# Patient Record
Sex: Male | Born: 1965
Health system: Southern US, Community
[De-identification: ages and names within clinical notes are randomized; demographics above are authoritative.]

## PROBLEM LIST (undated history)

## (undated) DIAGNOSIS — B1920 Unspecified viral hepatitis C without hepatic coma: Secondary | ICD-10-CM

## (undated) DIAGNOSIS — I10 Essential (primary) hypertension: Secondary | ICD-10-CM

## (undated) DIAGNOSIS — S0990XA Unspecified injury of head, initial encounter: Secondary | ICD-10-CM

## (undated) DIAGNOSIS — M329 Systemic lupus erythematosus, unspecified: Secondary | ICD-10-CM

## (undated) DIAGNOSIS — F419 Anxiety disorder, unspecified: Secondary | ICD-10-CM

## (undated) HISTORY — PX: COLONOSCOPY: SHX174

## (undated) HISTORY — PX: FEMUR FRACTURE SURGERY: SHX633

---

## 1998-03-27 ENCOUNTER — Emergency Department (HOSPITAL_COMMUNITY): Admission: EM | Admit: 1998-03-27 | Discharge: 1998-03-27 | Payer: Self-pay | Admitting: Emergency Medicine

## 1999-09-20 ENCOUNTER — Encounter: Payer: Self-pay | Admitting: Emergency Medicine

## 1999-09-20 ENCOUNTER — Emergency Department (HOSPITAL_COMMUNITY): Admission: EM | Admit: 1999-09-20 | Discharge: 1999-09-20 | Payer: Self-pay | Admitting: Emergency Medicine

## 1999-09-22 ENCOUNTER — Emergency Department (HOSPITAL_COMMUNITY): Admission: EM | Admit: 1999-09-22 | Discharge: 1999-09-22 | Payer: Self-pay | Admitting: Emergency Medicine

## 1999-09-23 ENCOUNTER — Encounter: Payer: Self-pay | Admitting: Emergency Medicine

## 2004-10-09 ENCOUNTER — Emergency Department (HOSPITAL_COMMUNITY): Admission: EM | Admit: 2004-10-09 | Discharge: 2004-10-10 | Payer: Self-pay | Admitting: Family Medicine

## 2005-06-12 ENCOUNTER — Encounter: Admission: RE | Admit: 2005-06-12 | Discharge: 2005-06-12 | Payer: Self-pay | Admitting: Gastroenterology

## 2006-02-28 ENCOUNTER — Emergency Department (HOSPITAL_COMMUNITY): Admission: EM | Admit: 2006-02-28 | Discharge: 2006-02-28 | Payer: Self-pay | Admitting: Family Medicine

## 2006-05-17 ENCOUNTER — Encounter: Admission: RE | Admit: 2006-05-17 | Discharge: 2006-05-17 | Payer: Self-pay | Admitting: Gastroenterology

## 2009-03-14 ENCOUNTER — Emergency Department (HOSPITAL_COMMUNITY): Admission: EM | Admit: 2009-03-14 | Discharge: 2009-03-14 | Payer: Self-pay | Admitting: Family Medicine

## 2009-07-14 ENCOUNTER — Ambulatory Visit (HOSPITAL_COMMUNITY): Admission: RE | Admit: 2009-07-14 | Discharge: 2009-07-14 | Payer: Self-pay | Admitting: Rheumatology

## 2009-10-02 ENCOUNTER — Emergency Department (HOSPITAL_COMMUNITY): Admission: EM | Admit: 2009-10-02 | Discharge: 2009-10-03 | Payer: Self-pay | Admitting: *Deleted

## 2010-12-04 LAB — POCT I-STAT, CHEM 8
BUN: 15 mg/dL (ref 6–23)
Calcium, Ion: 1.09 mmol/L — ABNORMAL LOW (ref 1.12–1.32)
Chloride: 99 mEq/L (ref 96–112)
Creatinine, Ser: 1.2 mg/dL (ref 0.4–1.5)
Glucose, Bld: 170 mg/dL — ABNORMAL HIGH (ref 70–99)
HCT: 38 % — ABNORMAL LOW (ref 39.0–52.0)
Hemoglobin: 12.9 g/dL — ABNORMAL LOW (ref 13.0–17.0)
Potassium: 3.6 mEq/L (ref 3.5–5.1)
Sodium: 132 mEq/L — ABNORMAL LOW (ref 135–145)
TCO2: 28 mmol/L (ref 0–100)

## 2011-02-17 IMAGING — CR DG HAND COMPLETE 3+V*L*
3 series · 3 of 3 positions shown · non-contrast
Comparison: None

CLINICAL DATA: Laceration

LEFT HAND - COMPLETE 3+ VIEW

[x hand pa left]
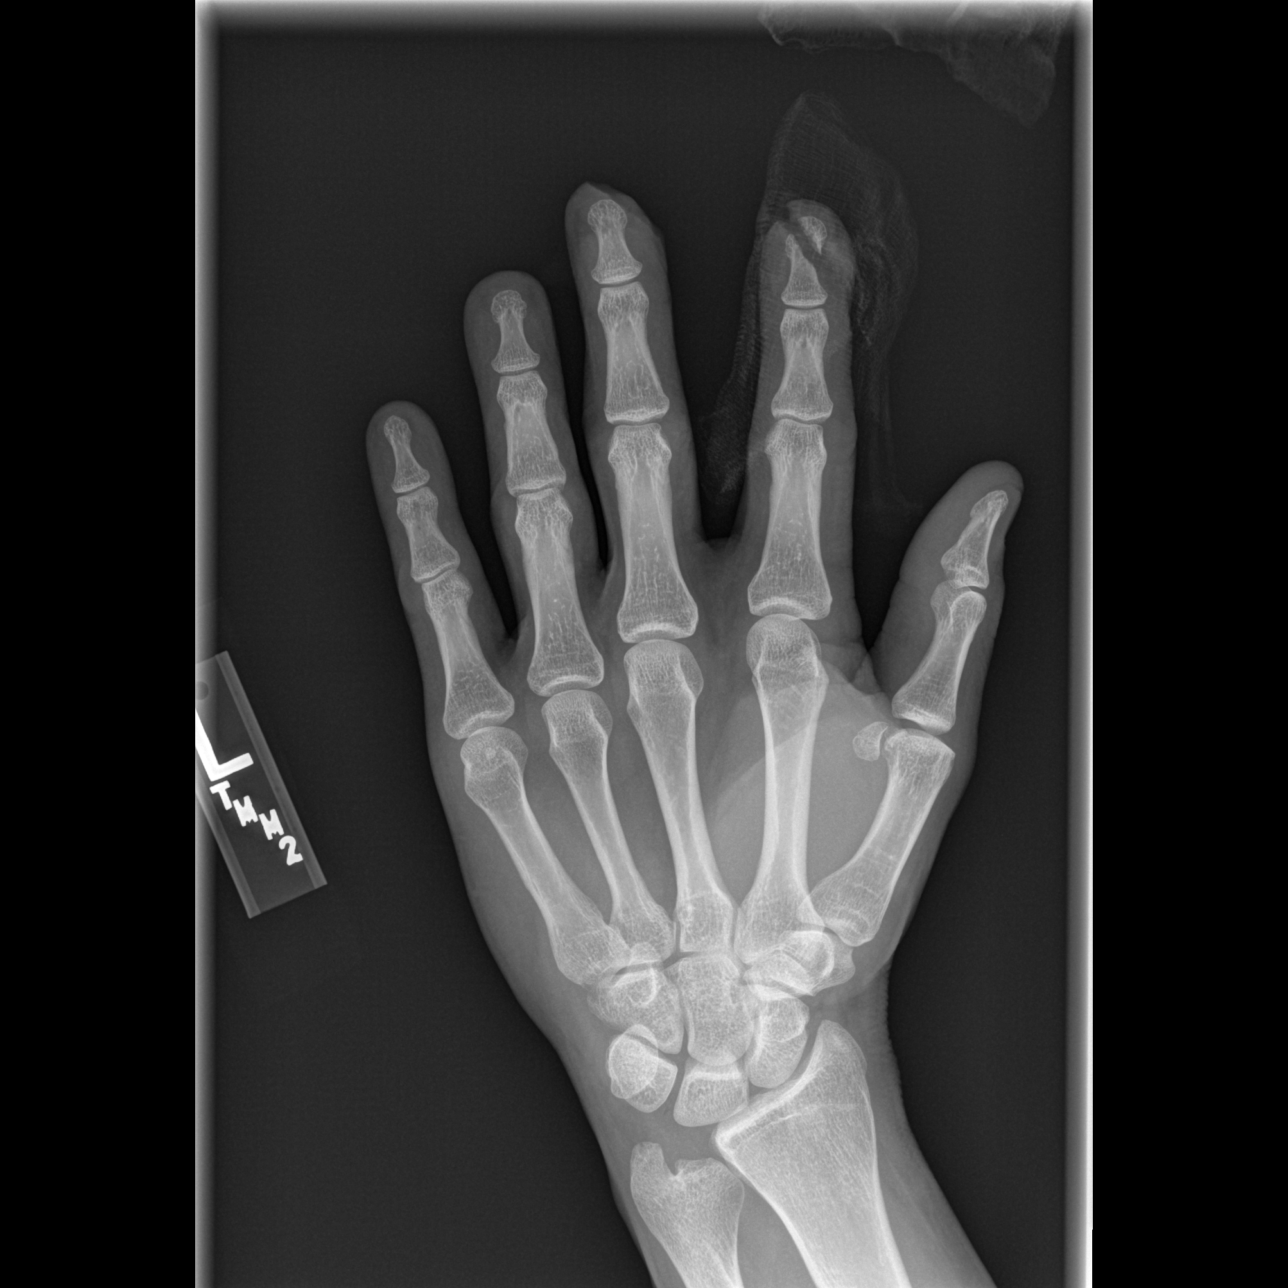

[x hand oblique left]
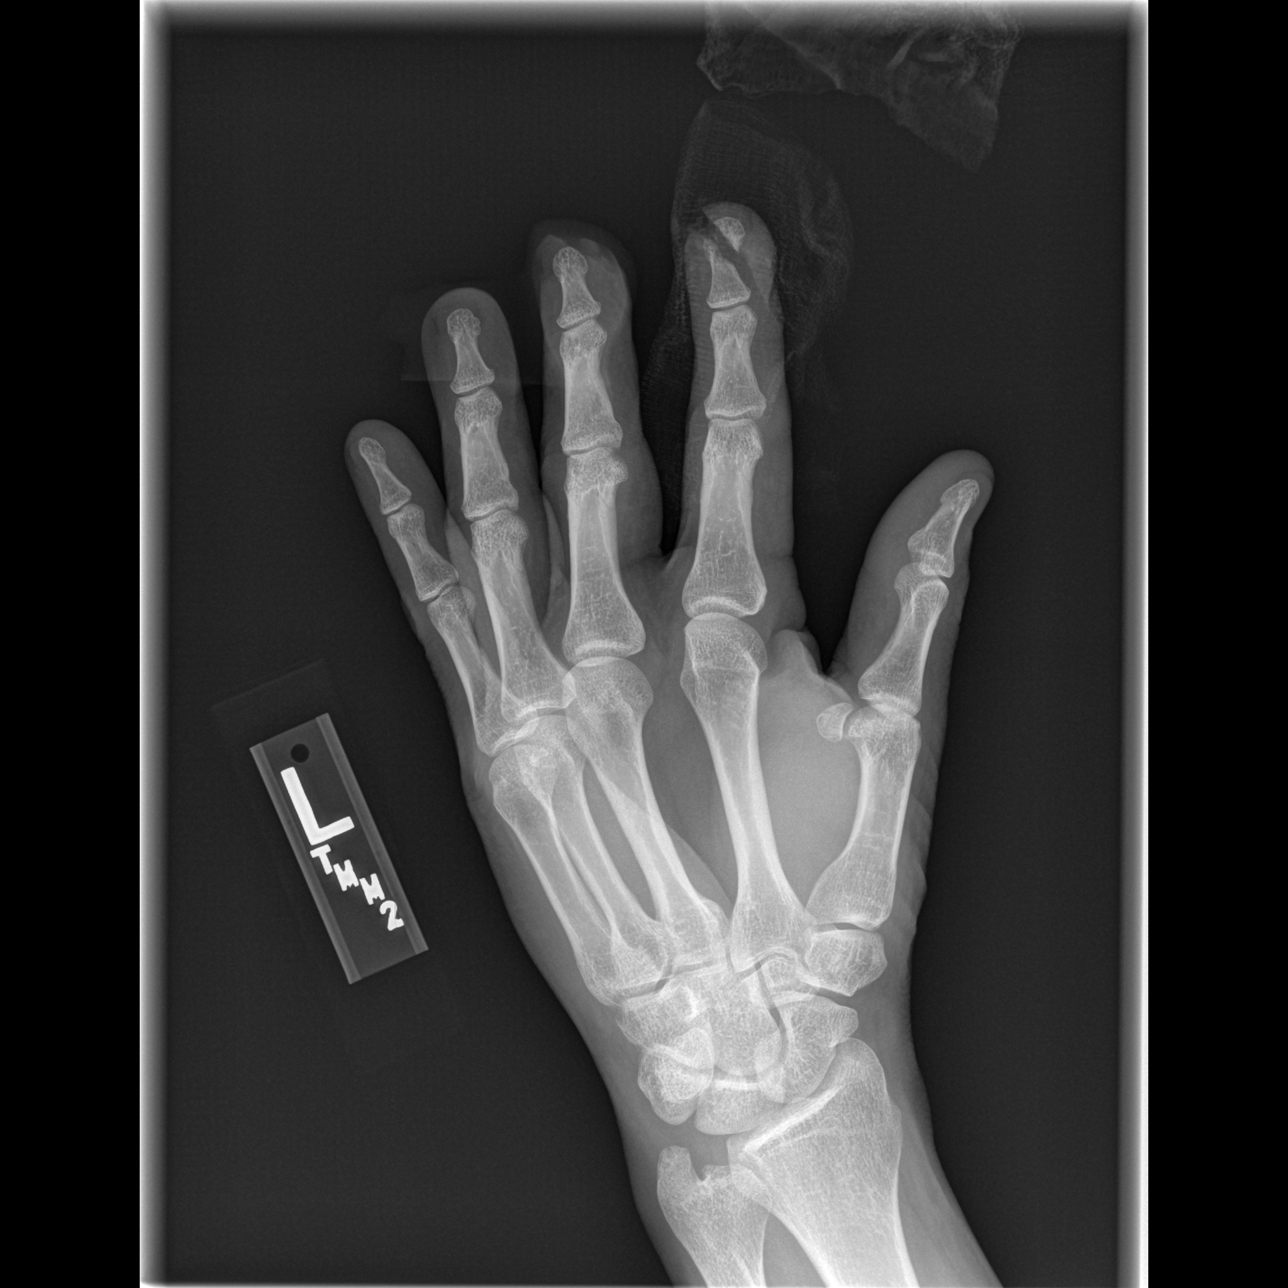

[x hand lat left]
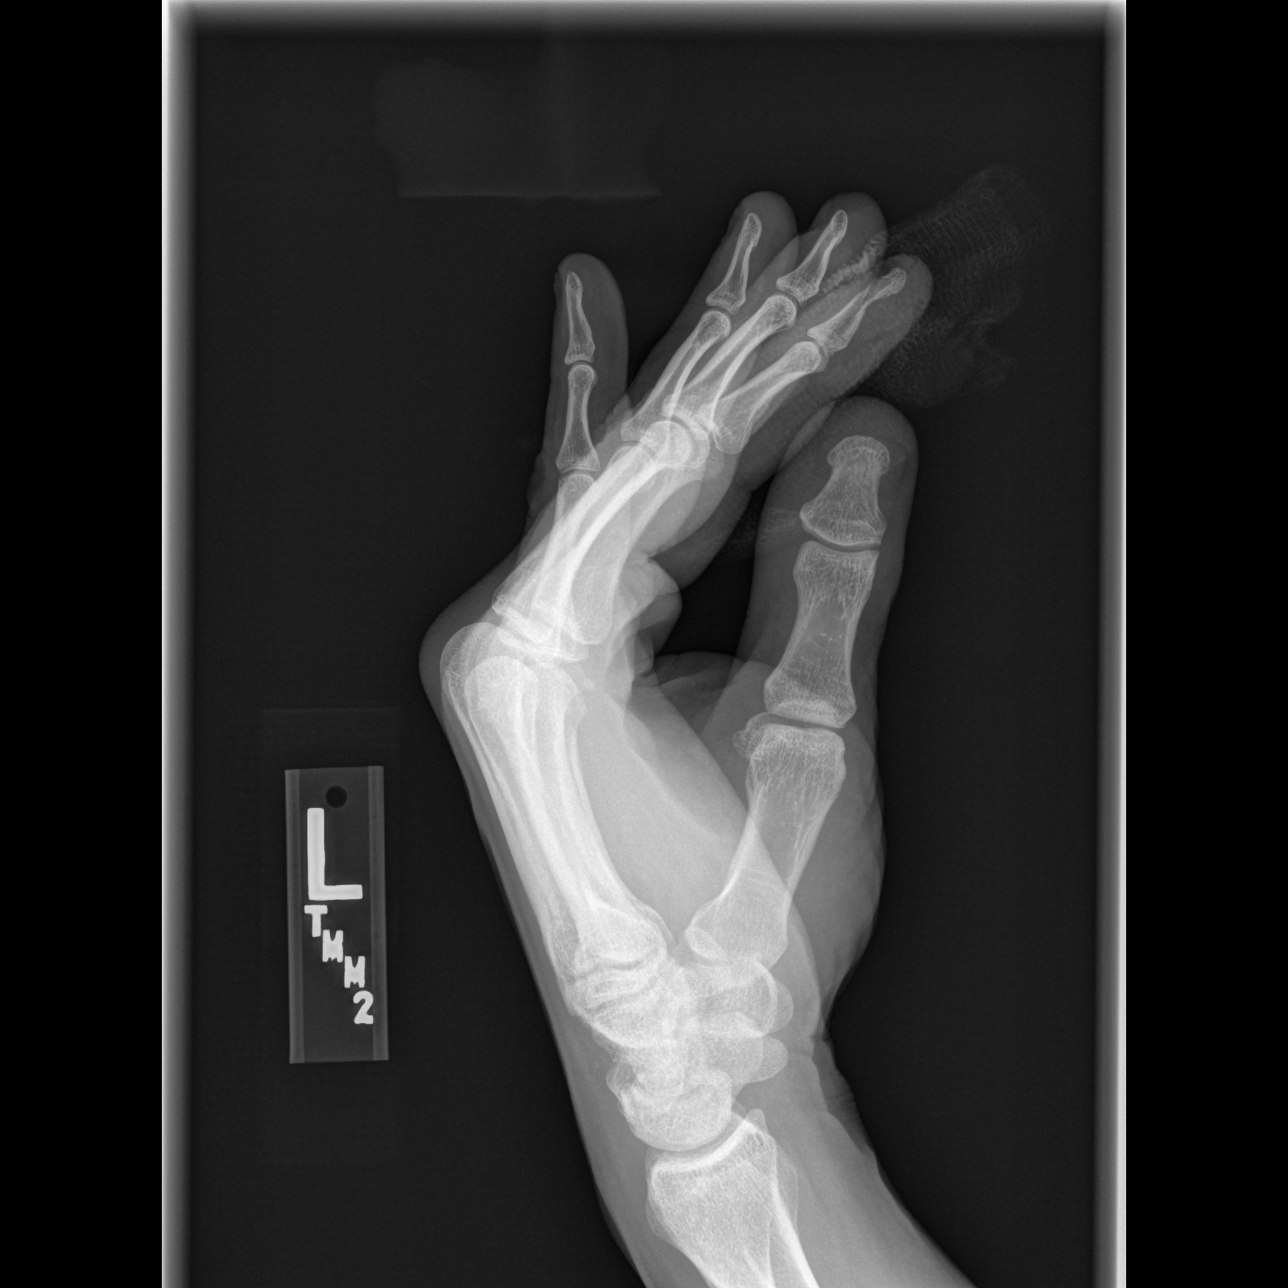

[3 of 3 positions shown; findings below may reference images not displayed]

FINDINGS: There has been soft tissue and bone amputation of the
tuft of the second distal phalanx.

Soft tissue amputation of the overlying the tuft of the third
distal phalanx is also noted.

The remaining osseous structures are intact.
IMPRESSION: 1.  There has been amputation of the tuft of the second distal
phalanx and soft tissues overlying the third distal phalanx.

## 2012-09-29 ENCOUNTER — Encounter (HOSPITAL_BASED_OUTPATIENT_CLINIC_OR_DEPARTMENT_OTHER): Payer: Self-pay | Admitting: *Deleted

## 2012-09-29 ENCOUNTER — Emergency Department (HOSPITAL_BASED_OUTPATIENT_CLINIC_OR_DEPARTMENT_OTHER)
Admission: EM | Admit: 2012-09-29 | Discharge: 2012-09-29 | Disposition: A | Discharge status: Home / Self Care | Payer: BC Managed Care – PPO | Attending: Emergency Medicine | Admitting: Emergency Medicine

## 2012-09-29 DIAGNOSIS — F411 Generalized anxiety disorder: Secondary | ICD-10-CM | POA: Insufficient documentation

## 2012-09-29 DIAGNOSIS — Z79899 Other long term (current) drug therapy: Secondary | ICD-10-CM | POA: Insufficient documentation

## 2012-09-29 DIAGNOSIS — Z8619 Personal history of other infectious and parasitic diseases: Secondary | ICD-10-CM | POA: Insufficient documentation

## 2012-09-29 DIAGNOSIS — Y9389 Activity, other specified: Secondary | ICD-10-CM | POA: Insufficient documentation

## 2012-09-29 DIAGNOSIS — F17200 Nicotine dependence, unspecified, uncomplicated: Secondary | ICD-10-CM | POA: Insufficient documentation

## 2012-09-29 DIAGNOSIS — S335XXA Sprain of ligaments of lumbar spine, initial encounter: Secondary | ICD-10-CM | POA: Insufficient documentation

## 2012-09-29 DIAGNOSIS — Z8739 Personal history of other diseases of the musculoskeletal system and connective tissue: Secondary | ICD-10-CM | POA: Insufficient documentation

## 2012-09-29 DIAGNOSIS — S39012A Strain of muscle, fascia and tendon of lower back, initial encounter: Secondary | ICD-10-CM

## 2012-09-29 DIAGNOSIS — Y929 Unspecified place or not applicable: Secondary | ICD-10-CM | POA: Insufficient documentation

## 2012-09-29 DIAGNOSIS — X500XXA Overexertion from strenuous movement or load, initial encounter: Secondary | ICD-10-CM | POA: Insufficient documentation

## 2012-09-29 DIAGNOSIS — I10 Essential (primary) hypertension: Secondary | ICD-10-CM | POA: Insufficient documentation

## 2012-09-29 HISTORY — DX: Unspecified viral hepatitis C without hepatic coma: B19.20

## 2012-09-29 HISTORY — DX: Essential (primary) hypertension: I10

## 2012-09-29 HISTORY — DX: Anxiety disorder, unspecified: F41.9

## 2012-09-29 HISTORY — DX: Systemic lupus erythematosus, unspecified: M32.9

## 2012-09-29 MED ORDER — CYCLOBENZAPRINE HCL 10 MG PO TABS: 10.0000 mg | ORAL_TABLET | Freq: Two times a day (BID) | ORAL | Status: DC | PRN

## 2012-09-29 MED ORDER — OXYCODONE-ACETAMINOPHEN 5-325 MG PO TABS: 2.0000 | ORAL_TABLET | ORAL | Status: DC | PRN

## 2012-09-29 MED ORDER — KETOROLAC TROMETHAMINE 60 MG/2ML IM SOLN
60.0000 mg | Freq: Once | INTRAMUSCULAR | Status: AC
  Administered 2012-09-29: 60 mg via INTRAMUSCULAR
  Filled 2012-09-29: qty 2

## 2012-09-29 MED ORDER — IBUPROFEN 800 MG PO TABS: 800.0000 mg | ORAL_TABLET | Freq: Three times a day (TID) | ORAL | Status: DC

## 2012-09-29 MED ORDER — HYDROMORPHONE HCL PF 2 MG/ML IJ SOLN
2.0000 mg | Freq: Once | INTRAMUSCULAR | Status: AC
  Administered 2012-09-29: 2 mg via INTRAMUSCULAR
  Filled 2012-09-29: qty 1

## 2012-09-29 NOTE — ED Notes (Signed)
Patient was trying to move a car and hurt his back.

## 2012-09-29 NOTE — ED Provider Notes (Signed)
Medical screening examination/treatment/procedure(s) were performed by non-physician practitioner and as supervising physician I was immediately available for consultation/collaboration.   Gavin Pound. Oletta Lamas, MD 09/29/12 2244

## 2012-09-29 NOTE — ED Provider Notes (Signed)
Medical screening examination/treatment/procedure(s) were performed by non-physician practitioner and as supervising physician I was immediately available for consultation/collaboration.   Gavin Pound. Oletta Lamas, MD 09/29/12 1610

## 2012-09-29 NOTE — ED Provider Notes (Addendum)
History     CSN: [id number]  Arrival date & time 09/29/12  1833   None     Chief Complaint  Patient presents with  . Back Pain    (Consider location/radiation/quality/duration/timing/severity/associated sxs/prior treatment) Patient is a 47 y.o. male presenting with back pain. The history is provided by the patient. No language interpreter was used.  Back Pain  This is a new problem. The current episode started 12 to 24 hours ago. The problem occurs constantly. The problem has been gradually worsening. Associated with: pushing a car. The pain is present in the lumbar spine. The quality of the pain is described as aching. The pain radiates to the left thigh. The pain is at a severity of 7/10. The pain is moderate. The pain is the same all the time. Stiffness is present all day.  Pt was trying to push a car and had sudden pain in his back.  Pt denies any problems with his back in the past.   Pt reports he has had high blood pressure  Past Medical History  Diagnosis Date  . Lupus   . Hypertension   . Hepatitis C   . Anxiety     History reviewed. No pertinent past surgical history.  No family history on file.  History  Substance Use Topics  . Smoking status: Current Every Day Smoker  . Smokeless tobacco: Not on file  . Alcohol Use: No      Review of Systems  Musculoskeletal: Positive for back pain.  All other systems reviewed and are negative.    Allergies  Review of patient's allergies indicates no known allergies.  Home Medications   Current Outpatient Rx  Name  Route  Sig  Dispense  Refill  . ALPRAZOLAM 0.25 MG PO TABS   Oral   Take 0.25 mg by mouth at bedtime as needed.         Marland Kitchen LISINOPRIL 10 MG PO TABS   Oral   Take 10 mg by mouth daily.         Marland Kitchen PANTOPRAZOLE SODIUM 20 MG PO TBEC   Oral   Take 20 mg by mouth daily.         . SERTRALINE HCL 100 MG PO TABS   Oral   Take 100 mg by mouth daily.         Marland Kitchen VARENICLINE TARTRATE 0.5 MG PO TABS   Oral   Take 0.5 mg by mouth 2 (two) times daily.         Marland Kitchen ZOLPIDEM TARTRATE 10 MG PO TABS   Oral   Take 10 mg by mouth at bedtime as needed.           BP 173/103  Pulse 72  Temp 98.1 F (36.7 C) (Oral)  Resp 20  SpO2 100%  Physical Exam  Nursing note and vitals reviewed. Constitutional: He appears well-developed and well-nourished.  Eyes: Pupils are equal, round, and reactive to light.  Neck: Normal range of motion.  Cardiovascular: Normal rate and normal heart sounds.   Pulmonary/Chest: Effort normal.  Abdominal: Soft.  Musculoskeletal:       Tender left lower lumbar area,  Decreased range of motion,  From lower extremities  Neurological: He is alert.  Skin: Skin is warm.    ED Course  Procedures (including critical care time)  Labs Reviewed - No data to display No results found.   1. Lumbar strain       MDM  Percocet and flexeril  Lonia Skinner [place], [place] 09/29/12 2242  Lonia Skinner [place], [place] 09/29/12 [phone]

## 2015-02-19 ENCOUNTER — Ambulatory Visit
Admission: RE | Admit: 2015-02-19 | Discharge: 2015-02-19 | Disposition: A | Discharge status: Home / Self Care | Payer: 59 | Source: Ambulatory Visit | Attending: Family Medicine | Admitting: Family Medicine

## 2015-02-19 ENCOUNTER — Other Ambulatory Visit: Payer: Self-pay | Admitting: Family Medicine

## 2015-02-19 DIAGNOSIS — J449 Chronic obstructive pulmonary disease, unspecified: Secondary | ICD-10-CM

## 2015-12-31 ENCOUNTER — Ambulatory Visit (HOSPITAL_COMMUNITY)
Admission: EM | Admit: 2015-12-31 | Discharge: 2015-12-31 | Disposition: A | Discharge status: Home / Self Care | Payer: 59 | Attending: Emergency Medicine | Admitting: Emergency Medicine

## 2015-12-31 ENCOUNTER — Ambulatory Visit (INDEPENDENT_AMBULATORY_CARE_PROVIDER_SITE_OTHER): Payer: 59

## 2015-12-31 ENCOUNTER — Encounter (HOSPITAL_COMMUNITY): Payer: Self-pay

## 2015-12-31 DIAGNOSIS — S99911A Unspecified injury of right ankle, initial encounter: Secondary | ICD-10-CM | POA: Diagnosis not present

## 2015-12-31 MED ORDER — HYDROCODONE-ACETAMINOPHEN 5-325 MG PO TABS: 1.0000 | ORAL_TABLET | Freq: Four times a day (QID) | ORAL | Status: DC | PRN

## 2015-12-31 NOTE — ED Notes (Signed)
Patient states he stepped through a ramp on a trailer and injured his right leg, injury took place last Sunday 12/19/2015 patient has taken ibuprofen and aleve for pain. No acute distress Wife at bedside

## 2015-12-31 NOTE — ED Provider Notes (Signed)
CSN: [id number]     Arrival date & time 12/31/15  1848 History   First MD Initiated Contact with Patient 12/31/15 1915     Chief Complaint  Patient presents with  . Leg Injury   (Consider location/radiation/quality/duration/timing/severity/associated sxs/prior Treatment) HPI He is a 50 year old man here for evaluation of right ankle injury. 5 days ago he was unloading a trailer when his right foot went through the trailer. He has had swelling, bruising, and pain since that time. He is able to walk on it, but with a limp and discomfort.  Past Medical History  Diagnosis Date  . Lupus (HCC)   . Hypertension   . Hepatitis C   . Anxiety    History reviewed. No pertinent past surgical history. No family history on file. Social History  Substance Use Topics  . Smoking status: Current Every Day Smoker  . Smokeless tobacco: None  . Alcohol Use: No    Review of Systems As in history of present illness Allergies  Review of patient's allergies indicates no known allergies.  Home Medications   Prior to Admission medications   Medication Sig Start Date End Date Taking? Authorizing Provider  ALPRAZolam (XANAX) 0.25 MG tablet Take 0.25 mg by mouth at bedtime as needed.   Yes Historical Provider, MD  cyclobenzaprine (FLEXERIL) 10 MG tablet Take 1 tablet (10 mg total) by mouth 2 (two) times daily as needed for muscle spasms. 09/29/12  Yes Lonia Skinner Sofia, PA-C  ibuprofen (ADVIL,MOTRIN) 800 MG tablet Take 1 tablet (800 mg total) by mouth 3 (three) times daily. 09/29/12  Yes Lonia Skinner Sofia, PA-C  lisinopril (PRINIVIL,ZESTRIL) 10 MG tablet Take 10 mg by mouth daily.   Yes Historical Provider, MD  pantoprazole (PROTONIX) 20 MG tablet Take 20 mg by mouth daily.   Yes Historical Provider, MD  sertraline (ZOLOFT) 100 MG tablet Take 100 mg by mouth daily.   Yes Historical Provider, MD  HYDROcodone-acetaminophen (NORCO) 5-325 MG tablet Take 1 tablet by mouth every 6 (six) hours as needed for moderate  pain. 12/31/15   Charm Rings, MD  oxyCODONE-acetaminophen (PERCOCET/ROXICET) 5-325 MG per tablet Take 2 tablets by mouth every 4 (four) hours as needed for pain. 09/29/12   Elson Areas, PA-C  varenicline (CHANTIX) 0.5 MG tablet Take 0.5 mg by mouth 2 (two) times daily.    Historical Provider, MD  zolpidem (AMBIEN) 10 MG tablet Take 10 mg by mouth at bedtime as needed.    Historical Provider, MD   Meds Ordered and Administered this Visit  Medications - No data to display  BP 157/86 mmHg  Pulse 84  Temp(Src) 98.6 F (37 C) (Oral)  SpO2 97% No data found.   Physical Exam  Constitutional: He is oriented to person, place, and time. He appears well-developed and well-nourished. No distress.  Cardiovascular: Normal rate.   Pulmonary/Chest: Effort normal.  Musculoskeletal:  Right ankle: He has diffuse swelling and bruising in the ankle and foot. He is tender over the medial malleolus primarily. He does have a nodule just superior to the medial malleolus most consistent with a hematoma. Palpable DP pulse.  Neurological: He is alert and oriented to person, place, and time.    ED Course  Procedures (including critical care time)  Labs Review Labs Reviewed - No data to display  Imaging Review Dg Ankle Complete Right  12/31/2015  CLINICAL DATA:  Right ankle pain, swelling and bruising after his foot got caught on a trailer 5 days ago. EXAM:  RIGHT ANKLE - COMPLETE 3+ VIEW COMPARISON:  None. FINDINGS: Diffuse soft tissue swelling. This is slightly more focal with a subcutaneous hematoma medial to the distal tibia. Distal tibia fixation hardware is noted. No acute fracture or dislocation seen. No ankle joint effusion. Minimal calcaneal spur formation. IMPRESSION: 1. No fracture. 2. Soft tissue swelling and soft tissue hematoma in the medial aspect of the distal leg. Electronically Signed   By: Beckie SaltsSteven  Reid M.D.   On: 12/31/2015 20:19     MDM   1. Right ankle injury, initial encounter     X-rays negative. Conservative management with rest, ice, elevation. Crutches for the next several days. Recommended avoiding aspirin and ibuprofen due to the bruising. Prescription for hydrocodone provided. Follow-up as needed.    Charm RingsErin J Nayleen Janosik, MD 12/31/15 2034

## 2015-12-31 NOTE — Discharge Instructions (Signed)
There are no broken bones in your ankle. You do have a lot of bruising and swelling. Keep your foot elevated and ice on the ankle as much as you can for the next 2 days. Use crutches for the next 2 days. Avoid aspirin and ibuprofen as these can make bruising worse. Use the hydrocodone every 4-6 hours as needed for pain. Follow-up as needed.

## 2016-07-06 IMAGING — CR DG CHEST 2V
2 series · 2 of 2 positions shown · non-contrast
Comparison: None.

CLINICAL DATA: COPD, cough, shortness of Breath

EXAM:
CHEST  2 VIEW

[w chest pa]
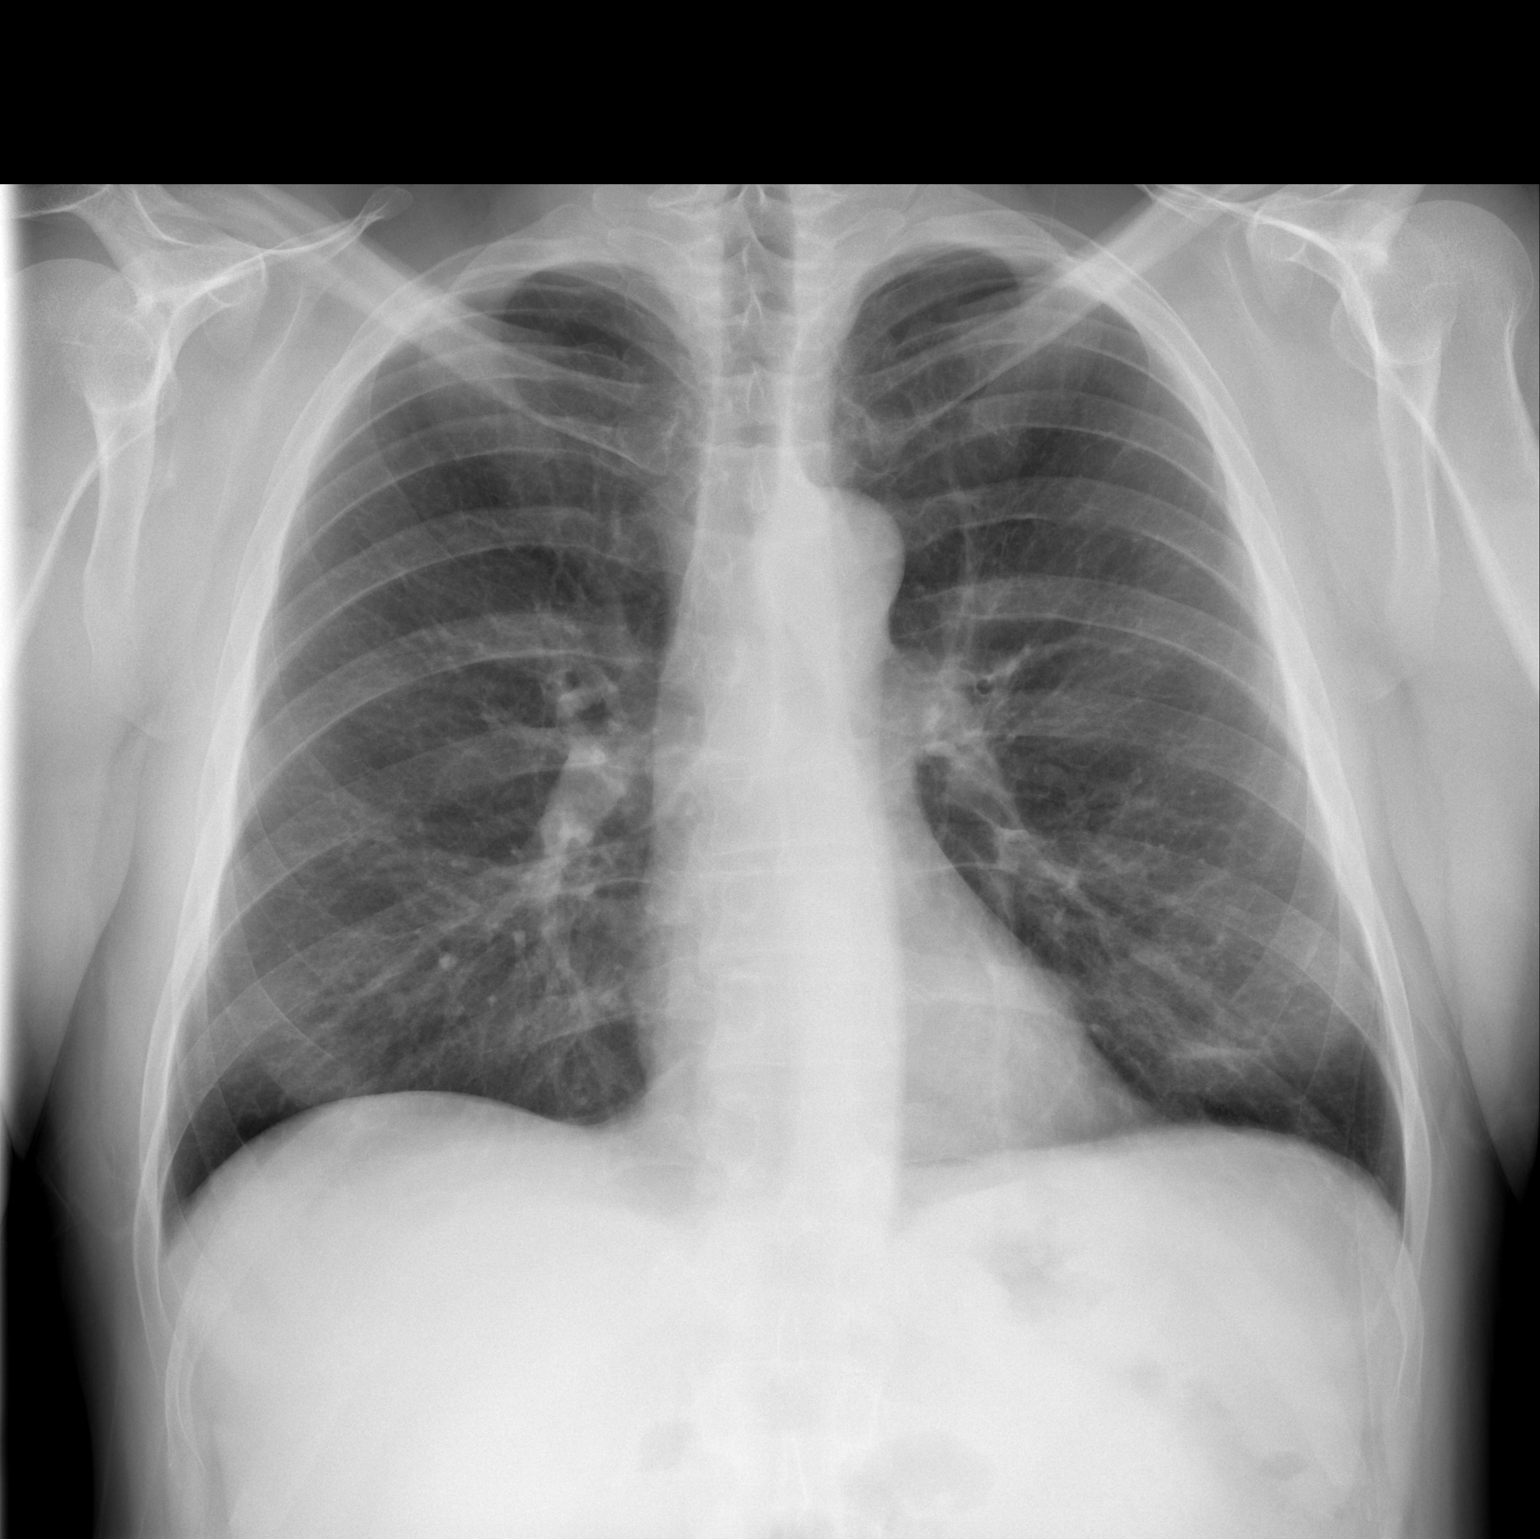

[w chest lat]
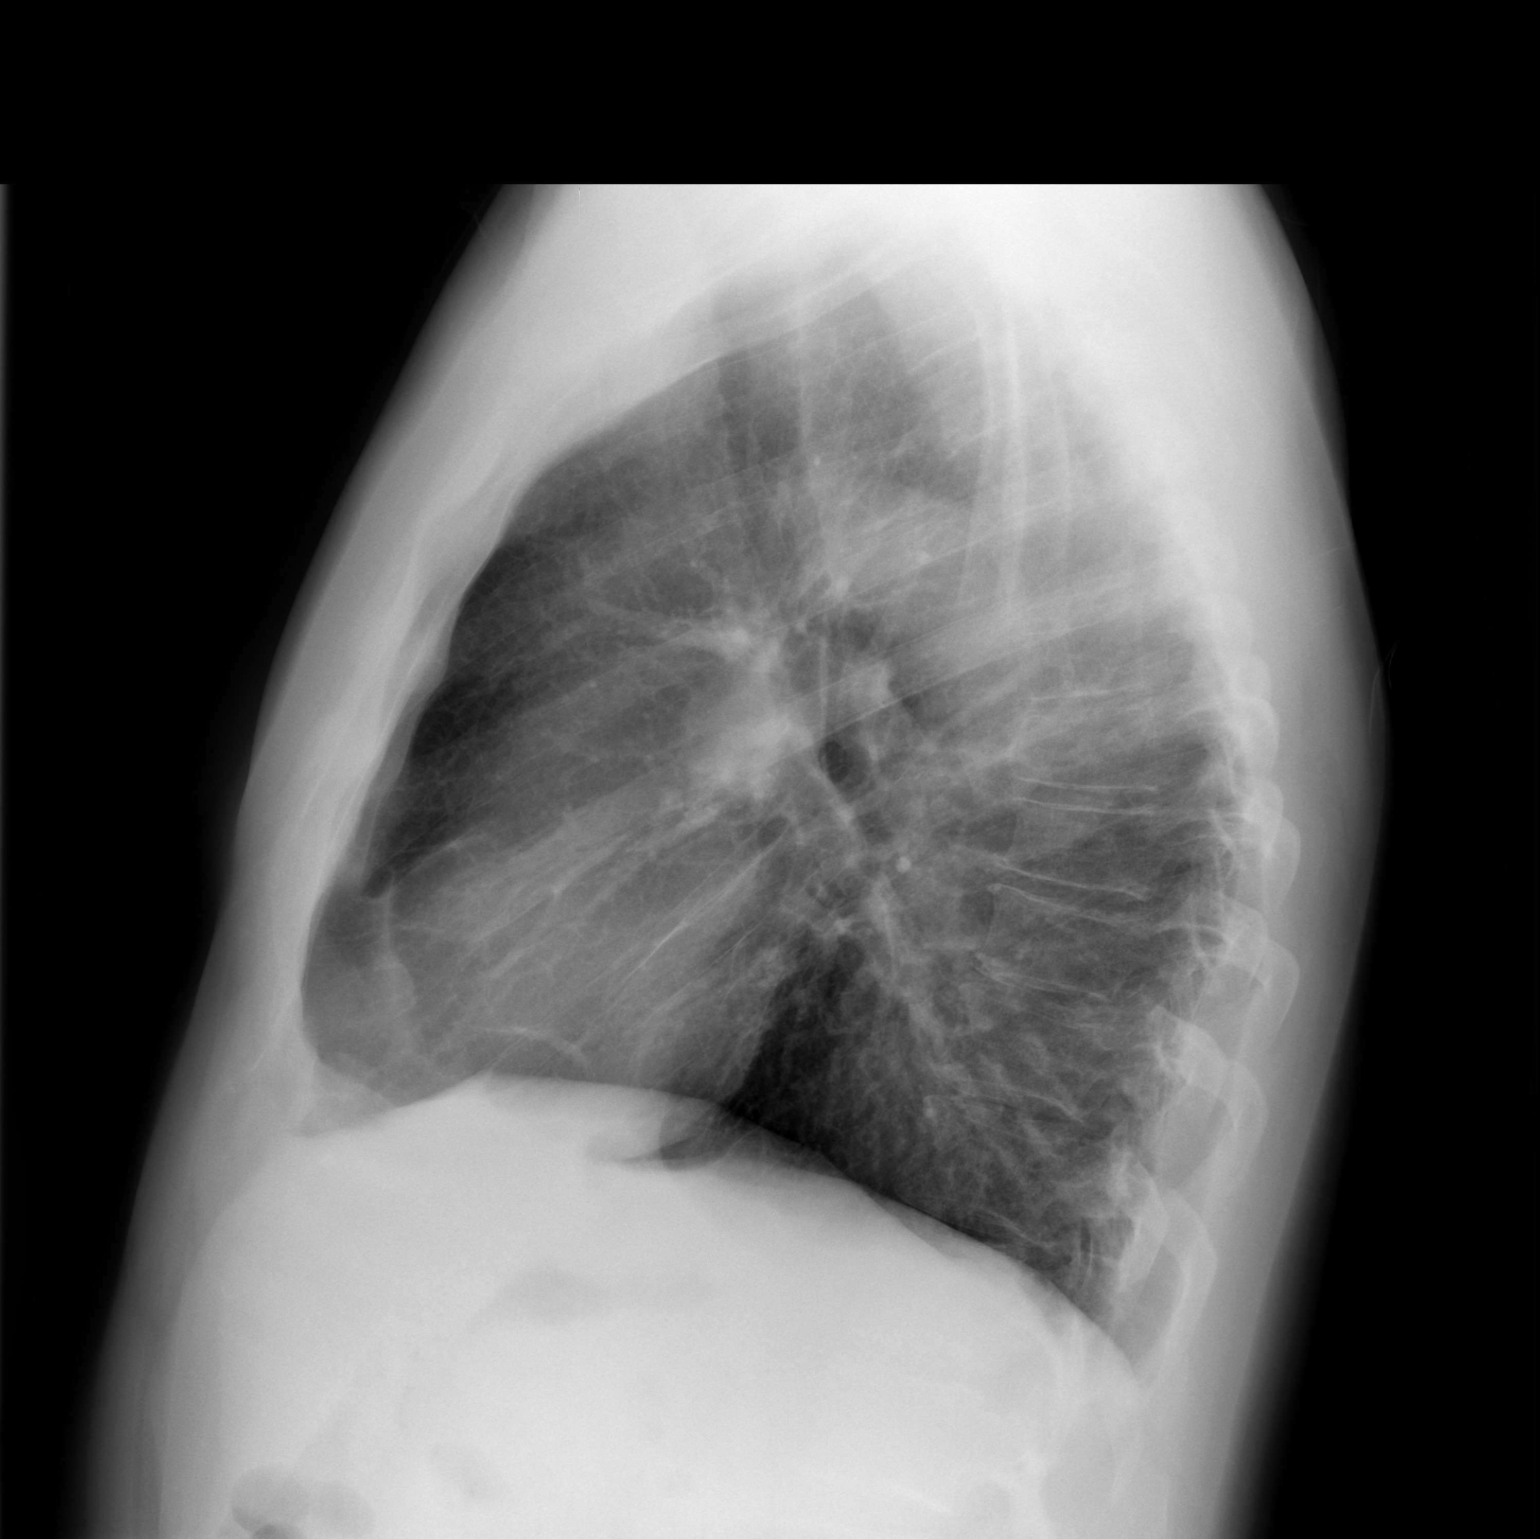

[2 of 2 positions shown; findings below may reference images not displayed]

FINDINGS: The heart size and mediastinal contours are within normal limits.
Both lungs are clear. The visualized skeletal structures are
unremarkable.
IMPRESSION: No active cardiopulmonary disease.

## 2016-10-02 DIAGNOSIS — G4733 Obstructive sleep apnea (adult) (pediatric): Secondary | ICD-10-CM | POA: Diagnosis not present

## 2016-10-12 DIAGNOSIS — G4733 Obstructive sleep apnea (adult) (pediatric): Secondary | ICD-10-CM | POA: Diagnosis not present

## 2016-10-19 DIAGNOSIS — G4733 Obstructive sleep apnea (adult) (pediatric): Secondary | ICD-10-CM | POA: Diagnosis not present

## 2016-11-12 DIAGNOSIS — G4733 Obstructive sleep apnea (adult) (pediatric): Secondary | ICD-10-CM | POA: Diagnosis not present

## 2016-12-10 DIAGNOSIS — G4733 Obstructive sleep apnea (adult) (pediatric): Secondary | ICD-10-CM | POA: Diagnosis not present

## 2016-12-18 DIAGNOSIS — K219 Gastro-esophageal reflux disease without esophagitis: Secondary | ICD-10-CM | POA: Diagnosis not present

## 2016-12-18 DIAGNOSIS — I10 Essential (primary) hypertension: Secondary | ICD-10-CM | POA: Diagnosis not present

## 2017-01-10 DIAGNOSIS — G4733 Obstructive sleep apnea (adult) (pediatric): Secondary | ICD-10-CM | POA: Diagnosis not present

## 2017-01-11 DIAGNOSIS — G4733 Obstructive sleep apnea (adult) (pediatric): Secondary | ICD-10-CM | POA: Diagnosis not present

## 2017-01-22 DIAGNOSIS — I10 Essential (primary) hypertension: Secondary | ICD-10-CM | POA: Diagnosis not present

## 2017-02-09 DIAGNOSIS — G4733 Obstructive sleep apnea (adult) (pediatric): Secondary | ICD-10-CM | POA: Diagnosis not present

## 2017-03-12 DIAGNOSIS — G4733 Obstructive sleep apnea (adult) (pediatric): Secondary | ICD-10-CM | POA: Diagnosis not present

## 2017-03-16 DIAGNOSIS — W57XXXA Bitten or stung by nonvenomous insect and other nonvenomous arthropods, initial encounter: Secondary | ICD-10-CM | POA: Diagnosis not present

## 2017-03-16 DIAGNOSIS — S70362A Insect bite (nonvenomous), left thigh, initial encounter: Secondary | ICD-10-CM | POA: Diagnosis not present

## 2017-04-09 DIAGNOSIS — S96911A Strain of unspecified muscle and tendon at ankle and foot level, right foot, initial encounter: Secondary | ICD-10-CM | POA: Diagnosis not present

## 2017-04-11 DIAGNOSIS — G4733 Obstructive sleep apnea (adult) (pediatric): Secondary | ICD-10-CM | POA: Diagnosis not present

## 2017-04-26 DIAGNOSIS — G4733 Obstructive sleep apnea (adult) (pediatric): Secondary | ICD-10-CM | POA: Diagnosis not present

## 2017-04-30 DIAGNOSIS — S96911A Strain of unspecified muscle and tendon at ankle and foot level, right foot, initial encounter: Secondary | ICD-10-CM | POA: Diagnosis not present

## 2017-05-14 DIAGNOSIS — M79671 Pain in right foot: Secondary | ICD-10-CM | POA: Diagnosis not present

## 2017-06-25 DIAGNOSIS — I10 Essential (primary) hypertension: Secondary | ICD-10-CM | POA: Diagnosis not present

## 2017-06-25 DIAGNOSIS — Z23 Encounter for immunization: Secondary | ICD-10-CM | POA: Diagnosis not present

## 2017-06-25 DIAGNOSIS — G2581 Restless legs syndrome: Secondary | ICD-10-CM | POA: Diagnosis not present

## 2017-08-28 DIAGNOSIS — G4733 Obstructive sleep apnea (adult) (pediatric): Secondary | ICD-10-CM | POA: Diagnosis not present

## 2017-10-15 DIAGNOSIS — M2241 Chondromalacia patellae, right knee: Secondary | ICD-10-CM | POA: Diagnosis not present

## 2017-10-15 DIAGNOSIS — S92334A Nondisplaced fracture of third metatarsal bone, right foot, initial encounter for closed fracture: Secondary | ICD-10-CM | POA: Diagnosis not present

## 2017-10-15 DIAGNOSIS — S92324A Nondisplaced fracture of second metatarsal bone, right foot, initial encounter for closed fracture: Secondary | ICD-10-CM | POA: Diagnosis not present

## 2017-10-15 DIAGNOSIS — M2242 Chondromalacia patellae, left knee: Secondary | ICD-10-CM | POA: Diagnosis not present

## 2017-10-29 DIAGNOSIS — M170 Bilateral primary osteoarthritis of knee: Secondary | ICD-10-CM | POA: Diagnosis not present

## 2017-11-12 DIAGNOSIS — M170 Bilateral primary osteoarthritis of knee: Secondary | ICD-10-CM | POA: Diagnosis not present

## 2017-11-26 DIAGNOSIS — M170 Bilateral primary osteoarthritis of knee: Secondary | ICD-10-CM | POA: Diagnosis not present

## 2017-12-24 DIAGNOSIS — K219 Gastro-esophageal reflux disease without esophagitis: Secondary | ICD-10-CM | POA: Diagnosis not present

## 2017-12-24 DIAGNOSIS — I10 Essential (primary) hypertension: Secondary | ICD-10-CM | POA: Diagnosis not present

## 2018-01-28 DIAGNOSIS — G4733 Obstructive sleep apnea (adult) (pediatric): Secondary | ICD-10-CM | POA: Diagnosis not present

## 2018-01-28 DIAGNOSIS — M2241 Chondromalacia patellae, right knee: Secondary | ICD-10-CM | POA: Diagnosis not present

## 2018-02-25 DIAGNOSIS — M25561 Pain in right knee: Secondary | ICD-10-CM | POA: Diagnosis not present

## 2018-03-01 DIAGNOSIS — T50991A Poisoning by other drugs, medicaments and biological substances, accidental (unintentional), initial encounter: Secondary | ICD-10-CM | POA: Diagnosis not present

## 2018-03-01 DIAGNOSIS — J449 Chronic obstructive pulmonary disease, unspecified: Secondary | ICD-10-CM | POA: Diagnosis not present

## 2018-03-01 DIAGNOSIS — T50901A Poisoning by unspecified drugs, medicaments and biological substances, accidental (unintentional), initial encounter: Secondary | ICD-10-CM | POA: Diagnosis not present

## 2018-04-11 ENCOUNTER — Ambulatory Visit (HOSPITAL_COMMUNITY)
Admission: RE | Admit: 2018-04-11 | Discharge: 2018-04-11 | Disposition: A | Discharge status: Home / Self Care | Payer: 59 | Source: Ambulatory Visit | Attending: Orthopedic Surgery | Admitting: Orthopedic Surgery

## 2018-04-11 ENCOUNTER — Other Ambulatory Visit (HOSPITAL_COMMUNITY): Payer: Self-pay | Admitting: Orthopedic Surgery

## 2018-04-11 DIAGNOSIS — R52 Pain, unspecified: Secondary | ICD-10-CM | POA: Diagnosis not present

## 2018-04-11 DIAGNOSIS — R609 Edema, unspecified: Secondary | ICD-10-CM | POA: Insufficient documentation

## 2018-04-11 DIAGNOSIS — M545 Low back pain: Secondary | ICD-10-CM | POA: Diagnosis not present

## 2018-04-11 NOTE — Progress Notes (Signed)
Bilateral lower extremity venous duplex has been completed. Negative for DVT.  There is evidence of age indeterminate superficial vein thrombosis involving the short saphenous vein of the right lower extremity. Results were given to Dr. Charlett BlakeVoytek.  04/11/18 11:13 AM Robert Mccullough RVT

## 2018-04-22 DIAGNOSIS — I10 Essential (primary) hypertension: Secondary | ICD-10-CM | POA: Diagnosis not present

## 2018-04-22 DIAGNOSIS — M79661 Pain in right lower leg: Secondary | ICD-10-CM | POA: Diagnosis not present

## 2018-04-22 DIAGNOSIS — J449 Chronic obstructive pulmonary disease, unspecified: Secondary | ICD-10-CM | POA: Diagnosis not present

## 2018-04-22 DIAGNOSIS — M79604 Pain in right leg: Secondary | ICD-10-CM | POA: Diagnosis not present

## 2018-06-24 DIAGNOSIS — M25572 Pain in left ankle and joints of left foot: Secondary | ICD-10-CM | POA: Diagnosis not present

## 2018-06-24 DIAGNOSIS — G2581 Restless legs syndrome: Secondary | ICD-10-CM | POA: Diagnosis not present

## 2018-06-24 DIAGNOSIS — I10 Essential (primary) hypertension: Secondary | ICD-10-CM | POA: Diagnosis not present

## 2018-07-09 DIAGNOSIS — G4733 Obstructive sleep apnea (adult) (pediatric): Secondary | ICD-10-CM | POA: Diagnosis not present

## 2018-12-30 DIAGNOSIS — K219 Gastro-esophageal reflux disease without esophagitis: Secondary | ICD-10-CM | POA: Diagnosis not present

## 2018-12-30 DIAGNOSIS — F419 Anxiety disorder, unspecified: Secondary | ICD-10-CM | POA: Diagnosis not present

## 2018-12-30 DIAGNOSIS — G2581 Restless legs syndrome: Secondary | ICD-10-CM | POA: Diagnosis not present

## 2018-12-30 DIAGNOSIS — I10 Essential (primary) hypertension: Secondary | ICD-10-CM | POA: Diagnosis not present

## 2019-07-10 DIAGNOSIS — G4733 Obstructive sleep apnea (adult) (pediatric): Secondary | ICD-10-CM | POA: Diagnosis not present

## 2019-07-25 DIAGNOSIS — G2581 Restless legs syndrome: Secondary | ICD-10-CM | POA: Diagnosis not present

## 2019-07-25 DIAGNOSIS — I10 Essential (primary) hypertension: Secondary | ICD-10-CM | POA: Diagnosis not present

## 2019-07-25 DIAGNOSIS — J449 Chronic obstructive pulmonary disease, unspecified: Secondary | ICD-10-CM | POA: Diagnosis not present

## 2019-07-25 DIAGNOSIS — F419 Anxiety disorder, unspecified: Secondary | ICD-10-CM | POA: Diagnosis not present

## 2019-10-08 DIAGNOSIS — G4733 Obstructive sleep apnea (adult) (pediatric): Secondary | ICD-10-CM | POA: Diagnosis not present

## 2020-01-06 DIAGNOSIS — G4733 Obstructive sleep apnea (adult) (pediatric): Secondary | ICD-10-CM | POA: Diagnosis not present

## 2020-01-19 DIAGNOSIS — J449 Chronic obstructive pulmonary disease, unspecified: Secondary | ICD-10-CM | POA: Diagnosis not present

## 2020-01-19 DIAGNOSIS — G2581 Restless legs syndrome: Secondary | ICD-10-CM | POA: Diagnosis not present

## 2020-01-19 DIAGNOSIS — F419 Anxiety disorder, unspecified: Secondary | ICD-10-CM | POA: Diagnosis not present

## 2020-01-19 DIAGNOSIS — Z13220 Encounter for screening for lipoid disorders: Secondary | ICD-10-CM | POA: Diagnosis not present

## 2020-01-19 DIAGNOSIS — I10 Essential (primary) hypertension: Secondary | ICD-10-CM | POA: Diagnosis not present

## 2020-02-18 DIAGNOSIS — Z1159 Encounter for screening for other viral diseases: Secondary | ICD-10-CM | POA: Diagnosis not present

## 2020-02-20 DIAGNOSIS — F329 Major depressive disorder, single episode, unspecified: Secondary | ICD-10-CM | POA: Diagnosis not present

## 2020-02-20 DIAGNOSIS — F411 Generalized anxiety disorder: Secondary | ICD-10-CM | POA: Diagnosis not present

## 2020-02-23 DIAGNOSIS — K5730 Diverticulosis of large intestine without perforation or abscess without bleeding: Secondary | ICD-10-CM | POA: Diagnosis not present

## 2020-02-23 DIAGNOSIS — D124 Benign neoplasm of descending colon: Secondary | ICD-10-CM | POA: Diagnosis not present

## 2020-02-23 DIAGNOSIS — Z1211 Encounter for screening for malignant neoplasm of colon: Secondary | ICD-10-CM | POA: Diagnosis not present

## 2020-03-19 DIAGNOSIS — F411 Generalized anxiety disorder: Secondary | ICD-10-CM | POA: Diagnosis not present

## 2020-03-19 DIAGNOSIS — F329 Major depressive disorder, single episode, unspecified: Secondary | ICD-10-CM | POA: Diagnosis not present

## 2020-03-23 DIAGNOSIS — M25561 Pain in right knee: Secondary | ICD-10-CM | POA: Diagnosis not present

## 2020-03-23 DIAGNOSIS — M25562 Pain in left knee: Secondary | ICD-10-CM | POA: Diagnosis not present

## 2020-04-05 DIAGNOSIS — G4733 Obstructive sleep apnea (adult) (pediatric): Secondary | ICD-10-CM | POA: Diagnosis not present

## 2020-07-06 DIAGNOSIS — G4733 Obstructive sleep apnea (adult) (pediatric): Secondary | ICD-10-CM | POA: Diagnosis not present

## 2020-07-07 DIAGNOSIS — I10 Essential (primary) hypertension: Secondary | ICD-10-CM | POA: Diagnosis not present

## 2020-07-07 DIAGNOSIS — J449 Chronic obstructive pulmonary disease, unspecified: Secondary | ICD-10-CM | POA: Diagnosis not present

## 2020-07-07 DIAGNOSIS — F419 Anxiety disorder, unspecified: Secondary | ICD-10-CM | POA: Diagnosis not present

## 2020-07-07 DIAGNOSIS — G2581 Restless legs syndrome: Secondary | ICD-10-CM | POA: Diagnosis not present

## 2020-09-01 DIAGNOSIS — S92351A Displaced fracture of fifth metatarsal bone, right foot, initial encounter for closed fracture: Secondary | ICD-10-CM | POA: Diagnosis not present

## 2020-09-15 ENCOUNTER — Encounter: Payer: Self-pay | Admitting: Orthopedic Surgery

## 2020-09-15 ENCOUNTER — Encounter: Payer: Self-pay | Admitting: Surgery

## 2020-09-15 ENCOUNTER — Ambulatory Visit: Payer: 59 | Admitting: Surgery

## 2020-09-15 ENCOUNTER — Ambulatory Visit: Payer: BC Managed Care – PPO | Admitting: Orthopedic Surgery

## 2020-09-15 ENCOUNTER — Other Ambulatory Visit: Payer: Self-pay | Admitting: Physician Assistant

## 2020-09-15 ENCOUNTER — Ambulatory Visit: Payer: Self-pay

## 2020-09-15 ENCOUNTER — Other Ambulatory Visit: Payer: Self-pay

## 2020-09-15 VITALS — BP 141/84 | HR 68 | Temp 97.6°F | Ht 75.0 in | Wt 220.0 lb

## 2020-09-15 DIAGNOSIS — S99191A Other physeal fracture of right metatarsal, initial encounter for closed fracture: Secondary | ICD-10-CM

## 2020-09-15 DIAGNOSIS — S92352A Displaced fracture of fifth metatarsal bone, left foot, initial encounter for closed fracture: Secondary | ICD-10-CM | POA: Diagnosis not present

## 2020-09-15 NOTE — Progress Notes (Signed)
Office Visit Note   Patient: Robert Mccullough           Date of Birth: 1965-11-05           MRN: [id number] Visit Date: 09/15/2020              Requested by: Salli Real, MD [address] [place],  [place] [address] PCP: Salli Real, MD  Chief Complaint  Patient presents with  . Right Foot - Pain    5th MT fx 2 weeks out referral from [facility].       HPI: Patient is a 54 year old gentleman who is seen for initial evaluation for displaced diaphyseal fifth metatarsal fracture right foot.  Patient states the injury happened about 2 weeks ago he was walking.  Patient has been in a fracture boot.  Patient states he does smoke daily denies a history of diabetes.  Assessment & Plan: Visit Diagnoses:  1. Displaced fracture of fifth metatarsal bone, left foot, initial encounter for closed fracture     Plan: Discussed with the patient with the progressive displacement of the fifth metatarsal his best option would be to proceed with open reduction internal fixation discussed operative versus nonoperative treatment.  Risks and benefits were discussed including risk of the wound not healing neurovascular injury need for additional surgery.  Patient states he understands states he would like to proceed with surgical intervention.  Discussed the importance of smoking cessation for wound healing and minimizing complications.  Follow-Up Instructions: Return in about 2 weeks (around 09/29/2020).   Ortho Exam  Patient is alert, oriented, no adenopathy, well-dressed, normal affect, normal respiratory effort. Examination patient has brawny skin color changes in the leg but no open ulcers.  He has a strong dorsalis pedis and posterior tibial pulse he has no skin defects there is tenderness to palpation of the fracture site.  In reviewing his radiographs from 2 weeks ago which showed essentially a nondisplaced fracture to radiographs obtained today which shows a complete fracture with  displacement.  Imaging: XR Foot Complete Right  Result Date: 09/15/2020 2 view radiographs of the right foot shows a displaced diaphyseal fracture of the fifth metatarsal.  No images are attached to the encounter.  Labs: No results found for: HGBA1C, ESRSEDRATE, CRP, LABURIC, REPTSTATUS, GRAMSTAIN, CULT, LABORGA   No results found for: ALBUMIN, PREALBUMIN, LABURIC  No results found for: MG No results found for: VD25OH  No results found for: PREALBUMIN CBC EXTENDED Latest Ref Rng & Units 10/02/2009  HGB 13.0 - 17.0 g/dL 12.9(L)  HCT 39.0 - 52.0 % 38.0(L)     There is no height or weight on file to calculate BMI.  Orders:  No orders of the defined types were placed in this encounter.  No orders of the defined types were placed in this encounter.    Procedures: No procedures performed  Clinical Data: No additional findings.  ROS:  All other systems negative, except as noted in the HPI. Review of Systems  Objective: Vital Signs: There were no vitals taken for this visit.  Specialty Comments:  No specialty comments available.  PMFS History: There are no problems to display for this patient.  Past Medical History:  Diagnosis Date  . Anxiety   . Hepatitis C   . Hypertension   . Lupus (HCC)     History reviewed. No pertinent family history.  History reviewed. No pertinent surgical history. Social History   Occupational History  . Not on file  Tobacco Use  .  Smoking status: Current Every Day Smoker  . Smokeless tobacco: Never Used  Substance and Sexual Activity  . Alcohol use: No  . Drug use: No  . Sexual activity: Not on file

## 2020-09-15 NOTE — Progress Notes (Signed)
Office Visit Note   Patient: Robert Mccullough           Date of Birth: 10-25-1965           MRN: [id number] Visit Date: 09/15/2020              Requested by: Salli Real, MD [address] [place],  [place] [address] PCP: Salli Real, MD   Assessment & Plan: Visit Diagnoses:  1. Other physeal fracture of right metatarsal, initial encounter for closed fracture   Displaced fifth metatarsal fracture   Plan: With patient's displaced fifth metatarsal fracture I advised him that he may need surgical invention with ORIF procedure. Patient is made an appointment to see Dr. Aldean Baker this afternoon at 1 PM to see if this fracture can be treated operatively versus nonoperatively. Consult greatly appreciated.  recommend the patient continue wearing his boot and be nonweightbearing with crutches. I do not believe patient has been overly compliant with weightbearing restrictions leading up to this visit. We did discuss that noncompliance could lead to poor healing of his fracture which could lead to chronic issues with pain. All questions answered.  Follow-Up Instructions: Return for appointment  with Dr Lajoyce Corners this afternoon at 1pm.   Orders:  Orders Placed This Encounter  Procedures  . XR Foot Complete Right   No orders of the defined types were placed in this encounter.     Procedures: No procedures performed   Clinical Data: No additional findings.   Subjective: Chief Complaint  Patient presents with  . Right Foot - Fracture    HPI 54 year old white male comes in today with complaints of right foot pain and fifth metatarsal fracture. 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 08/31/2020 while working he was walking and he felt a "sharp pain" in the lateral aspect of his foot. Denies any problems leading up to this. He went to [facility] in [place] 09/01/2020 and was  diagnosed with a "right foot stress fracture". I do not have any access to those x-rays or notes from that visit. They put patient in a cam boot and instructed him to use crutches. States that he did follow-up with them yesterday and patient decided to be seen in [place]. 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 He complains of pain lateral foot. Review of Systems No current cardiac pulmonary GI GU issues  Objective: Vital Signs: BP (!) 141/84 (BP Location: Left Arm, Patient Position: Sitting, Cuff Size: Large)   Pulse 68   Temp 97.6 F (36.4 C) (Oral)   Ht 6\' 3"  (1.905 m)   Wt 220 lb (99.8 kg)   BMI 27.50 kg/m   Physical Exam Eyes:     Extraocular Movements: Extraocular movements intact.     Pupils: Pupils are equal, round, and reactive to light.  Pulmonary:     Effort: No respiratory distress.  Musculoskeletal:     Comments: Patient does have some swelling right lateral foot. He is exquisitely tender over the fracture site as to be expected.  Neurological:     Mental Status: He is alert and oriented to person, place, and time.  Psychiatric:        Mood and Affect: Mood  normal.     Ortho Exam  Specialty Comments:  No specialty comments available.  Imaging: No results found.   PMFS History: There are no problems to display for this patient.  Past Medical History:  Diagnosis Date  . Anxiety   . Hepatitis C   . Hypertension   . Lupus (HCC)     No family history on file.  No past surgical history on file. Social History   Occupational History  . Not on file  Tobacco Use  . Smoking status: Current Every Day Smoker  . Smokeless tobacco: Never Used  Substance and Sexual Activity  . Alcohol use: No  . Drug use: No  . Sexual activity: Not on file

## 2020-09-22 ENCOUNTER — Other Ambulatory Visit (HOSPITAL_COMMUNITY)
Admission: RE | Admit: 2020-09-22 | Discharge: 2020-09-22 | Disposition: A | Discharge status: Home / Self Care | Payer: BC Managed Care – PPO | Source: Ambulatory Visit | Attending: Orthopedic Surgery | Admitting: Orthopedic Surgery

## 2020-09-22 DIAGNOSIS — Z01812 Encounter for preprocedural laboratory examination: Secondary | ICD-10-CM | POA: Insufficient documentation

## 2020-09-22 DIAGNOSIS — I10 Essential (primary) hypertension: Secondary | ICD-10-CM | POA: Diagnosis not present

## 2020-09-22 DIAGNOSIS — Y9301 Activity, walking, marching and hiking: Secondary | ICD-10-CM | POA: Diagnosis not present

## 2020-09-22 DIAGNOSIS — Z79899 Other long term (current) drug therapy: Secondary | ICD-10-CM | POA: Diagnosis not present

## 2020-09-22 DIAGNOSIS — Z20822 Contact with and (suspected) exposure to covid-19: Secondary | ICD-10-CM | POA: Insufficient documentation

## 2020-09-22 DIAGNOSIS — F17200 Nicotine dependence, unspecified, uncomplicated: Secondary | ICD-10-CM | POA: Diagnosis not present

## 2020-09-22 DIAGNOSIS — Z888 Allergy status to other drugs, medicaments and biological substances status: Secondary | ICD-10-CM | POA: Diagnosis not present

## 2020-09-22 DIAGNOSIS — Z7951 Long term (current) use of inhaled steroids: Secondary | ICD-10-CM | POA: Diagnosis not present

## 2020-09-22 DIAGNOSIS — S92351A Displaced fracture of fifth metatarsal bone, right foot, initial encounter for closed fracture: Secondary | ICD-10-CM | POA: Diagnosis not present

## 2020-09-22 LAB — SARS CORONAVIRUS 2 (TAT 6-24 HRS): SARS Coronavirus 2: NEGATIVE

## 2020-09-23 ENCOUNTER — Encounter (HOSPITAL_COMMUNITY): Payer: Self-pay | Admitting: Orthopedic Surgery

## 2020-09-23 ENCOUNTER — Other Ambulatory Visit: Payer: Self-pay

## 2020-09-23 NOTE — Progress Notes (Addendum)
Mr. Robert Mccullough denies chest pain or shortness of breath. .Patient tested negative for Covid_1/5/22_ and has been in quarantine since that time.  Mr. Robert Mccullough said he is not taking any medications in am, I encourage patient to take Metroprolol and Xanax if needed.

## 2020-09-24 ENCOUNTER — Encounter (HOSPITAL_COMMUNITY): Payer: Self-pay | Admitting: Orthopedic Surgery

## 2020-09-24 ENCOUNTER — Ambulatory Visit (HOSPITAL_COMMUNITY)
Admission: RE | Admit: 2020-09-24 | Discharge: 2020-09-24 | Disposition: A | Discharge status: Home / Self Care | Payer: BC Managed Care – PPO | Source: Ambulatory Visit | Attending: Orthopedic Surgery | Admitting: Orthopedic Surgery

## 2020-09-24 ENCOUNTER — Ambulatory Visit (HOSPITAL_COMMUNITY): Payer: BC Managed Care – PPO

## 2020-09-24 ENCOUNTER — Encounter (HOSPITAL_COMMUNITY)
Admission: RE | Disposition: A | Discharge status: Home / Self Care | Payer: Self-pay | Source: Ambulatory Visit | Attending: Orthopedic Surgery

## 2020-09-24 ENCOUNTER — Ambulatory Visit (HOSPITAL_COMMUNITY): Payer: BC Managed Care – PPO | Admitting: Anesthesiology

## 2020-09-24 ENCOUNTER — Other Ambulatory Visit: Payer: Self-pay

## 2020-09-24 DIAGNOSIS — S92351A Displaced fracture of fifth metatarsal bone, right foot, initial encounter for closed fracture: Secondary | ICD-10-CM | POA: Insufficient documentation

## 2020-09-24 DIAGNOSIS — Y9301 Activity, walking, marching and hiking: Secondary | ICD-10-CM | POA: Diagnosis not present

## 2020-09-24 DIAGNOSIS — S92901A Unspecified fracture of right foot, initial encounter for closed fracture: Secondary | ICD-10-CM

## 2020-09-24 DIAGNOSIS — F17200 Nicotine dependence, unspecified, uncomplicated: Secondary | ICD-10-CM | POA: Diagnosis not present

## 2020-09-24 DIAGNOSIS — I10 Essential (primary) hypertension: Secondary | ICD-10-CM | POA: Insufficient documentation

## 2020-09-24 DIAGNOSIS — Z7951 Long term (current) use of inhaled steroids: Secondary | ICD-10-CM | POA: Insufficient documentation

## 2020-09-24 DIAGNOSIS — Z888 Allergy status to other drugs, medicaments and biological substances status: Secondary | ICD-10-CM | POA: Insufficient documentation

## 2020-09-24 DIAGNOSIS — Z79899 Other long term (current) drug therapy: Secondary | ICD-10-CM | POA: Insufficient documentation

## 2020-09-24 DIAGNOSIS — B1920 Unspecified viral hepatitis C without hepatic coma: Secondary | ICD-10-CM | POA: Diagnosis not present

## 2020-09-24 DIAGNOSIS — Z20822 Contact with and (suspected) exposure to covid-19: Secondary | ICD-10-CM | POA: Diagnosis not present

## 2020-09-24 DIAGNOSIS — F419 Anxiety disorder, unspecified: Secondary | ICD-10-CM | POA: Diagnosis not present

## 2020-09-24 HISTORY — DX: Unspecified injury of head, initial encounter: S09.90XA

## 2020-09-24 HISTORY — PX: ORIF TOE FRACTURE: SHX5032

## 2020-09-24 LAB — COMPREHENSIVE METABOLIC PANEL
ALT: 35 U/L (ref 0–44)
AST: 26 U/L (ref 15–41)
Albumin: 4 g/dL (ref 3.5–5.0)
Alkaline Phosphatase: 65 U/L (ref 38–126)
Anion gap: 12 (ref 5–15)
BUN: 16 mg/dL (ref 6–20)
CO2: 23 mmol/L (ref 22–32)
Calcium: 9 mg/dL (ref 8.9–10.3)
Chloride: 100 mmol/L (ref 98–111)
Creatinine, Ser: 1.16 mg/dL (ref 0.61–1.24)
GFR, Estimated: >60 mL/min (ref >60)
Glucose, Bld: 73 mg/dL (ref 70–99)
Potassium: 3.8 mmol/L (ref 3.5–5.1)
Sodium: 135 mmol/L (ref 135–145)
Total Bilirubin: 0.4 mg/dL (ref 0.3–1.2)
Total Protein: 7 g/dL (ref 6.5–8.1)

## 2020-09-24 LAB — CBC
HCT: 44.1 % (ref 39.0–52.0)
Hemoglobin: 14.8 g/dL (ref 13.0–17.0)
MCH: 31.7 pg (ref 26.0–34.0)
MCHC: 33.6 g/dL (ref 30.0–36.0)
MCV: 94.4 fL (ref 80.0–100.0)
Platelets: 331 10*3/uL (ref 150–400)
RBC: 4.67 MIL/uL (ref 4.22–5.81)
RDW: 12 % (ref 11.5–15.5)
WBC: 7.5 10*3/uL (ref 4.0–10.5)
nRBC: 0 % (ref 0.0–0.2)

## 2020-09-24 SURGERY — OPEN REDUCTION INTERNAL FIXATION (ORIF) METATARSAL (TOE) FRACTURE
Anesthesia: Choice | Site: Toe | Laterality: Right

## 2020-09-24 MED ORDER — PROPOFOL 500 MG/50ML IV EMUL
INTRAVENOUS | Status: DC | PRN
  Administered 2020-09-24: 75 ug/kg/min via INTRAVENOUS

## 2020-09-24 MED ORDER — MIDAZOLAM HCL 2 MG/2ML IJ SOLN
INTRAMUSCULAR | Status: AC
  Filled 2020-09-24: qty 2

## 2020-09-24 MED ORDER — OXYCODONE-ACETAMINOPHEN 5-325 MG PO TABS: 1.0000 | ORAL_TABLET | ORAL | 0 refills | Status: AC | PRN

## 2020-09-24 MED ORDER — LACTATED RINGERS IV SOLN: INTRAVENOUS | Status: DC

## 2020-09-24 MED ORDER — ORAL CARE MOUTH RINSE
15.0000 mL | Freq: Once | OROMUCOSAL | Status: AC
  Administered 2020-09-24: 15 mL via OROMUCOSAL

## 2020-09-24 MED ORDER — LACTATED RINGERS IV SOLN: INTRAVENOUS | Status: DC | PRN

## 2020-09-24 MED ORDER — METOPROLOL SUCCINATE ER 25 MG PO TB24
50.0000 mg | ORAL_TABLET | ORAL | Status: AC
  Administered 2020-09-24: 50 mg via ORAL

## 2020-09-24 MED ORDER — LIDOCAINE 2% (20 MG/ML) 5 ML SYRINGE
INTRAMUSCULAR | Status: AC
  Filled 2020-09-24: qty 5

## 2020-09-24 MED ORDER — PROPOFOL 10 MG/ML IV BOLUS
INTRAVENOUS | Status: DC | PRN
  Administered 2020-09-24: 30 mg via INTRAVENOUS
  Administered 2020-09-24 (×4): 20 mg via INTRAVENOUS

## 2020-09-24 MED ORDER — PROPOFOL 1000 MG/100ML IV EMUL
INTRAVENOUS | Status: AC
  Filled 2020-09-24: qty 100

## 2020-09-24 MED ORDER — MIDAZOLAM HCL 5 MG/5ML IJ SOLN
INTRAMUSCULAR | Status: DC | PRN
  Administered 2020-09-24: 2 mg via INTRAVENOUS

## 2020-09-24 MED ORDER — CHLORHEXIDINE GLUCONATE 0.12 % MT SOLN
15.0000 mL | Freq: Once | OROMUCOSAL | Status: AC
  Filled 2020-09-24: qty 15

## 2020-09-24 MED ORDER — METOPROLOL SUCCINATE ER 25 MG PO TB24
ORAL_TABLET | ORAL | Status: AC
  Filled 2020-09-24: qty 2

## 2020-09-24 MED ORDER — FENTANYL CITRATE (PF) 250 MCG/5ML IJ SOLN
INTRAMUSCULAR | Status: AC
  Filled 2020-09-24: qty 5

## 2020-09-24 MED ORDER — FENTANYL CITRATE (PF) 100 MCG/2ML IJ SOLN
INTRAMUSCULAR | Status: DC | PRN
  Administered 2020-09-24 (×2): 50 ug via INTRAVENOUS

## 2020-09-24 MED ORDER — 0.9 % SODIUM CHLORIDE (POUR BTL) OPTIME
TOPICAL | Status: DC | PRN
  Administered 2020-09-24: 1000 mL

## 2020-09-24 MED ORDER — PROPOFOL 10 MG/ML IV BOLUS
INTRAVENOUS | Status: AC
  Filled 2020-09-24: qty 40

## 2020-09-24 MED ORDER — CEFAZOLIN SODIUM-DEXTROSE 2-4 GM/100ML-% IV SOLN
2.0000 g | INTRAVENOUS | Status: AC
  Administered 2020-09-24: 2 g via INTRAVENOUS
  Filled 2020-09-24: qty 100

## 2020-09-24 SURGICAL SUPPLY — 56 items
BIT DRILL CANN 3/16 3.8X180 (DRILL) ×1 IMPLANT
BLADE AVERAGE 25X9 (BLADE) IMPLANT
BLADE MINI RND TIP GREEN BEAV (BLADE) IMPLANT
BNDG COHESIVE 1X5 TAN STRL LF (GAUZE/BANDAGES/DRESSINGS) IMPLANT
BNDG COHESIVE 6X5 TAN STRL LF (GAUZE/BANDAGES/DRESSINGS) ×2 IMPLANT
BNDG ESMARK 4X9 LF (GAUZE/BANDAGES/DRESSINGS) ×2 IMPLANT
BNDG GAUZE ELAST 4 BULKY (GAUZE/BANDAGES/DRESSINGS) ×2 IMPLANT
CORD BIPOLAR FORCEPS 12FT (ELECTRODE) ×2 IMPLANT
COTTON STERILE ROLL (GAUZE/BANDAGES/DRESSINGS) IMPLANT
COVER SURGICAL LIGHT HANDLE (MISCELLANEOUS) ×2 IMPLANT
COVER WAND RF STERILE (DRAPES) IMPLANT
CUFF TOURN SGL QUICK 18X4 (TOURNIQUET CUFF) IMPLANT
CUFF TOURN SGL QUICK 24 (TOURNIQUET CUFF)
CUFF TOURN SGL QUICK 34 (TOURNIQUET CUFF)
CUFF TOURN SGL QUICK 42 (TOURNIQUET CUFF) IMPLANT
CUFF TRNQT CYL 24X4X16.5-23 (TOURNIQUET CUFF) IMPLANT
CUFF TRNQT CYL 34X4.125X (TOURNIQUET CUFF) IMPLANT
DRAPE OEC MINIVIEW 54X84 (DRAPES) ×2 IMPLANT
DRAPE U-SHAPE 47X51 STRL (DRAPES) ×2 IMPLANT
DRILL CANN 3/16 3.8X180 (DRILL) ×2
DRSG ADAPTIC 3X8 NADH LF (GAUZE/BANDAGES/DRESSINGS) ×4 IMPLANT
DURAPREP 26ML APPLICATOR (WOUND CARE) ×2 IMPLANT
ELECT REM PT RETURN 9FT ADLT (ELECTROSURGICAL) ×2
ELECTRODE REM PT RTRN 9FT ADLT (ELECTROSURGICAL) ×1 IMPLANT
GAUZE SPONGE 2X2 8PLY STRL LF (GAUZE/BANDAGES/DRESSINGS) IMPLANT
GAUZE SPONGE 4X4 12PLY STRL (GAUZE/BANDAGES/DRESSINGS) ×2 IMPLANT
GLOVE BIOGEL PI IND STRL 9 (GLOVE) ×1 IMPLANT
GLOVE BIOGEL PI INDICATOR 9 (GLOVE) ×1
GLOVE SURG ORTHO 9.0 STRL STRW (GLOVE) ×2 IMPLANT
GOWN STRL REUS W/ TWL XL LVL3 (GOWN DISPOSABLE) ×2 IMPLANT
GOWN STRL REUS W/TWL XL LVL3 (GOWN DISPOSABLE) ×4
K-WIRE 1.6X20 (WIRE) ×2
KIT BASIN OR (CUSTOM PROCEDURE TRAY) ×2 IMPLANT
KIT TURNOVER KIT B (KITS) ×2 IMPLANT
KWIRE 1.6X20 (WIRE) ×1 IMPLANT
MANIFOLD NEPTUNE II (INSTRUMENTS) ×2 IMPLANT
NEEDLE HYPO 25GX1X1/2 BEV (NEEDLE) IMPLANT
NS IRRIG 1000ML POUR BTL (IV SOLUTION) ×2 IMPLANT
PACK ORTHO EXTREMITY (CUSTOM PROCEDURE TRAY) ×2 IMPLANT
PAD ARMBOARD 7.5X6 YLW CONV (MISCELLANEOUS) ×4 IMPLANT
PAD CAST 4YDX4 CTTN HI CHSV (CAST SUPPLIES) IMPLANT
PADDING CAST COTTON 4X4 STRL (CAST SUPPLIES)
SCREW CANN MONSTER BT 5.5X65 (Screw) ×2 IMPLANT
SPECIMEN JAR SMALL (MISCELLANEOUS) IMPLANT
SPONGE GAUZE 2X2 STER 10/PKG (GAUZE/BANDAGES/DRESSINGS)
SUCTION FRAZIER HANDLE 10FR (MISCELLANEOUS) ×2
SUCTION TUBE FRAZIER 10FR DISP (MISCELLANEOUS) ×1 IMPLANT
SUT ETHILON 2 0 PSLX (SUTURE) ×4 IMPLANT
SUT VIC AB 2-0 CT1 27 (SUTURE)
SUT VIC AB 2-0 CT1 TAPERPNT 27 (SUTURE) IMPLANT
SYR CONTROL 10ML LL (SYRINGE) IMPLANT
TAP 5.5 (TAP) ×2 IMPLANT
TOWEL GREEN STERILE (TOWEL DISPOSABLE) ×2 IMPLANT
TOWEL GREEN STERILE FF (TOWEL DISPOSABLE) ×2 IMPLANT
TUBE CONNECTING 12X1/4 (SUCTIONS) IMPLANT
WATER STERILE IRR 1000ML POUR (IV SOLUTION) IMPLANT

## 2020-09-24 NOTE — Anesthesia Postprocedure Evaluation (Signed)
Anesthesia Post Note  Patient: Robert Mccullough  Procedure(s) Performed: OPEN REDUCTION INTERNAL FIXATION (ORIF) RIGHT FIFTH METATARSAL (TOE) FRACTURE (Right Toe)     Patient location during evaluation: PACU Anesthesia Type: MAC and Regional Level of consciousness: awake and alert Pain management: pain level controlled Vital Signs Assessment: post-procedure vital signs reviewed and stable Respiratory status: spontaneous breathing, nonlabored ventilation, respiratory function stable and patient connected to nasal cannula oxygen Cardiovascular status: stable and blood pressure returned to baseline Postop Assessment: no apparent nausea or vomiting Anesthetic complications: no   No complications documented.  Last Vitals:  Vitals:   09/24/20 0830 09/24/20 0841  BP: 124/86 (!) 141/84  Pulse: (!) 55 (!) 54  Resp: 16 14  Temp:  36.4 C  SpO2: 98% 95%    Last Pain:  Vitals:   09/24/20 0841  TempSrc:   PainSc: 0-No pain                 Bomani Oommen COKER

## 2020-09-24 NOTE — Interval H&P Note (Signed)
History and Physical Interval Note:  09/24/2020 6:47 AM  Robert Mccullough  has presented today for surgery, with the diagnosis of Displaced 5th Metatarsal Fracture Right Foot.  The various methods of treatment have been discussed with the patient and family. After consideration of risks, benefits and other options for treatment, the patient has consented to  Procedure(s): OPEN REDUCTION INTERNAL FIXATION (ORIF) RIGHT FIFTH METATARSAL (TOE) FRACTURE (Right) as a surgical intervention.  The patient's history has been reviewed, patient examined, no change in status, stable for surgery.  I have reviewed the patient's chart and labs.  Questions were answered to the patient's satisfaction.     Nadara Mustard

## 2020-09-24 NOTE — Anesthesia Postprocedure Evaluation (Signed)
Anesthesia Post Note  Patient: Robert Mccullough  Procedure(s) Performed: OPEN REDUCTION INTERNAL FIXATION (ORIF) RIGHT FIFTH METATARSAL (TOE) FRACTURE (Right Toe)     Patient location during evaluation: PACU Anesthesia Type: MAC Level of consciousness: awake and alert Pain management: pain level controlled Vital Signs Assessment: post-procedure vital signs reviewed and stable Respiratory status: spontaneous breathing, nonlabored ventilation, respiratory function stable and patient connected to nasal cannula oxygen Cardiovascular status: stable and blood pressure returned to baseline Postop Assessment: no apparent nausea or vomiting Anesthetic complications: no   No complications documented.  Last Vitals:  Vitals:   09/24/20 0830 09/24/20 0841  BP: 124/86 (!) 141/84  Pulse: (!) 55 (!) 54  Resp: 16 14  Temp:  36.4 C  SpO2: 98% 95%    Last Pain:  Vitals:   09/24/20 0841  TempSrc:   PainSc: 0-No pain                 Erandi Lemma COKER

## 2020-09-24 NOTE — Op Note (Signed)
09/24/2020  8:12 AM  PATIENT:  Robert Mccullough    PRE-OPERATIVE DIAGNOSIS:  Displaced 5th Metatarsal Fracture Right Foot  POST-OPERATIVE DIAGNOSIS:  Same  PROCEDURE:  OPEN REDUCTION INTERNAL FIXATION (ORIF) RIGHT FIFTH METATARSAL (TOE) FRACTURE C arm fluoroscopy to verify reduction  SURGEON:  Nadara Mustard, MD  PHYSICIAN ASSISTANT:None ANESTHESIA:   General  PREOPERATIVE INDICATIONS:  Robert Mccullough is a  55 y.o. male with a diagnosis of Displaced 5th Metatarsal Fracture Right Foot who failed conservative measures and elected for surgical management.    The risks benefits and alternatives were discussed with the patient preoperatively including but not limited to the risks of infection, bleeding, nerve injury, cardiopulmonary complications, the need for revision surgery, among others, and the patient was willing to proceed.  OPERATIVE IMPLANTS: Cannulated Biomet screw 5.5 mm in diameter and 65 mm long  @ENCIMAGES @  OPERATIVE FINDINGS: C-arm fluoroscopy verified reduction in both AP and lateral planes.  OPERATIVE PROCEDURE: Patient was brought the operating room after undergoing a regional block.  The right lower extremity was prepped using DuraPrep draped into a sterile field a timeout was called.  Incision was made just proximal to the base of the fifth metatarsal a guidewire was inserted high in the inside at the base of the fifth metatarsal C-arm fluoroscopy verified alignment a drill was used to advance the wire across the fracture site.  This was reamed with a 3.8 mm drill and a 5.5 mm tap was used to tap the threads a 65 mm screw was inserted with good compression across the fracture site C-arm fluoroscopy verified reduction.  The guidewire was removed wound was irrigated normal saline incision was closed using 2-0 nylon a sterile dressing was applied.  Patient was taken the PACU in stable condition.   DISCHARGE PLANNING:  Antibiotic duration: Preoperative  antibiotics  Weightbearing: Nonweightbearing on the right  Pain medication: Prescription for Percocet  Dressing care/ Wound VAC: Follow-up in the office 1 week to change the dressing  Ambulatory devices: Crutches  Discharge to: Home.  Follow-up: In the office 1 week post operative.

## 2020-09-24 NOTE — Transfer of Care (Signed)
Immediate Anesthesia Transfer of Care Note  Patient: Robert Mccullough  Procedure(s) Performed: OPEN REDUCTION INTERNAL FIXATION (ORIF) RIGHT FIFTH METATARSAL (TOE) FRACTURE (Right Toe)  Patient Location: PACU  Anesthesia Type:MAC and Regional  Level of Consciousness: awake, alert  and oriented  Airway & Oxygen Therapy: Patient Spontanous Breathing and Patient connected to face mask oxygen  Post-op Assessment: Report given to RN and Post -op Vital signs reviewed and stable  Post vital signs: Reviewed and stable  Last Vitals:  Vitals Value Taken Time  BP 132/81 09/24/20 0812  Temp 36.4 C 09/24/20 0813  Pulse 53 09/24/20 0817  Resp 15 09/24/20 0817  SpO2 99 % 09/24/20 0817  Vitals shown include unvalidated device data.  Last Pain:  Vitals:   09/24/20 0813  TempSrc:   PainSc: 0-No pain      Patients Stated Pain Goal: 5 (48/88/91 6945)  Complications: No complications documented.

## 2020-09-24 NOTE — H&P (Signed)
Robert Mccullough is an 55 y.o. male.   Chief Complaint: Right Foot Pain HPI: Patient is a 55 year old gentleman who is seen for initial evaluation for displaced diaphyseal fifth metatarsal fracture right foot.  Patient states the injury happened about 2 weeks ago he was walking.  Patient has been in a fracture boot.  Patient states he does smoke daily denies a history of diabetes.  Past Medical History:  Diagnosis Date  . Anxiety   . Head injury    no loss of conciousness  . Hepatitis C    Has been treated  . Hypertension   . Lupus [facility])     Past Surgical History:  Procedure Laterality Date  . COLONOSCOPY    . FEMUR FRACTURE SURGERY Right    age 52    History reviewed. No pertinent family history. Social History:  reports that he has been smoking. He has a 80.00 pack-year smoking history. He has never used smokeless tobacco. He reports current alcohol use of about 12.0 standard drinks of alcohol per week. He reports that he does not use drugs.  Allergies:  Allergies  Allergen Reactions  . Bupropion Hcl Er (Xl) Other (See Comments)    Unknown  . Lisinopril Cough    Medications Prior to Admission  Medication Sig Dispense Refill  . acetaminophen (TYLENOL) 500 MG tablet Take 1,000 mg by mouth in the morning, at noon, and at bedtime.    Marland Kitchen albuterol (VENTOLIN HFA) 108 (90 Base) MCG/ACT inhaler Inhale 3 puffs into the lungs 3 (three) times daily with meals as needed (copd).    . ALPRAZolam (XANAX) 0.5 MG tablet Take 0.5 mg by mouth 3 (three) times daily as needed for anxiety.    Marland Kitchen amLODipine (NORVASC) 10 MG tablet Take 10 mg by mouth daily.    . ARIPiprazole (ABILIFY) 5 MG tablet Take 5 mg by mouth daily.    Marland Kitchen BREO ELLIPTA 100-25 MCG/INH AEPB Inhale 1 puff into the lungs daily.    Marland Kitchen ibuprofen (ADVIL) 200 MG tablet Take 600 mg by mouth 3 (three) times daily as needed for moderate pain or mild pain.    . metoprolol succinate (TOPROL-XL) 50 MG 24 hr tablet Take 50 mg by mouth daily.     . Multiple Vitamins-Minerals (MULTIVITAMIN WITH MINERALS) tablet Take 1 tablet by mouth daily.    . pantoprazole (PROTONIX) 40 MG injection Take 40 mg by mouth daily as needed for heartburn.    . sertraline (ZOLOFT) 100 MG tablet Take 100 mg by mouth daily.    Marland Kitchen tiZANidine (ZANAFLEX) 4 MG tablet Take 4-8 mg by mouth at bedtime as needed for sleep.      Results for orders placed or performed during the hospital encounter of 09/22/20 (from the past 48 hour(s))  SARS CORONAVIRUS 2 (TAT 6-24 HRS) Nasopharyngeal Nasopharyngeal Swab     Status: None   Collection Time: 09/22/20 12:47 PM   Specimen: Nasopharyngeal Swab  Result Value Ref Range   SARS Coronavirus 2 NEGATIVE NEGATIVE    Comment: (NOTE) SARS-CoV-2 target nucleic acids are NOT DETECTED.  The SARS-CoV-2 RNA is generally detectable in upper and lower respiratory specimens during the acute phase of infection. Negative results do not preclude SARS-CoV-2 infection, do not rule out co-infections with other pathogens, and should not be used as the sole basis for treatment or other patient management decisions. Negative results must be combined with clinical observations, patient history, and epidemiological information. The expected result is Negative.  Fact Sheet for Patients:  HairSlick.no  Fact Sheet for Healthcare Providers: quierodirigir.com  This test is not yet approved or cleared by the [place] FDA and  has been authorized for detection and/or diagnosis of SARS-CoV-2 by FDA under an Emergency Use Authorization (EUA). This EUA will remain  in effect (meaning this test can be used) for the duration of the COVID-19 declaration under Se ction 564(b)(1) of the Act, 21 U.S.C. section 360bbb-3(b)(1), unless the authorization is terminated or revoked sooner.  Performed at [facility], [address] [place], [place] [address]    No results found.  Review of  Systems  All other systems reviewed and are negative.   There were no vitals taken for this visit. Physical Exam:.Patient is alert, oriented, no adenopathy, well-dressed, normal affect, normal respiratory effort. Examination patient has brawny skin color changes in the leg but no open ulcers.  He has a strong dorsalis pedis and posterior tibial pulse he has no skin defects there is tenderness to palpation of the fracture site.  In reviewing his radiographs from 2 weeks ago which showed essentially a nondisplaced fracture to radiographs obtained today which shows a complete fracture with displacement.Heart RRR Lungs Clear Assessment/Plan 1. Displaced fracture of fifth metatarsal bone, left foot, initial encounter for closed fracture     Plan: Discussed with the patient with the progressive displacement of the fifth metatarsal his best option would be to proceed with open reduction internal fixation discussed operative versus nonoperative treatment.  Risks and benefits were discussed including risk of the wound not healing neurovascular injury need for additional surgery.  Patient states he understands states he would like to proceed with surgical intervention.  Discussed the importance of smoking cessation for wound healing and minimizing complications.   [place] Jaquelinne Glendening, PA 09/24/2020, 6:01 AM

## 2020-09-24 NOTE — Progress Notes (Signed)
Orthopedic Tech Progress Note Patient Details:  Robert Mccullough 08-08-1966 [id number] PACU RN called requesting a POST OP SHOE for patient. Applied in PACU Ortho Devices Type of Ortho Device: Postop shoe/boot Ortho Device/Splint Location: LLE Ortho Device/Splint Interventions: Application   Post Interventions Patient Tolerated: Well Instructions Provided: Care of device   Donald Pore 09/24/2020, 10:52 AM

## 2020-09-24 NOTE — Anesthesia Preprocedure Evaluation (Signed)
Anesthesia Evaluation  Patient identified by MRN, date of birth, ID band Patient awake    Reviewed: Allergy & Precautions, NPO status , Patient's Chart, lab work & pertinent test results  Airway Mallampati: II  TM Distance: >3 FB Neck ROM: Full    Dental  (+) Edentulous Upper, Edentulous Lower   Pulmonary Current Smoker,    breath sounds clear to auscultation       Cardiovascular hypertension,  Rhythm:Regular Rate:Normal     Neuro/Psych    GI/Hepatic   Endo/Other    Renal/GU      Musculoskeletal   Abdominal   Peds  Hematology   Anesthesia Other Findings   Reproductive/Obstetrics                             Anesthesia Physical Anesthesia Plan  ASA: III  Anesthesia Plan: MAC and Regional   Post-op Pain Management:    Induction: Intravenous  PONV Risk Score and Plan: Ondansetron and Dexamethasone  Airway Management Planned: Natural Airway and Simple Face Mask  Additional Equipment:   Intra-op Plan:   Post-operative Plan:   Informed Consent: I have reviewed the patients History and Physical, chart, labs and discussed the procedure including the risks, benefits and alternatives for the proposed anesthesia with the patient or authorized representative who has indicated his/her understanding and acceptance.       Plan Discussed with: CRNA and Anesthesiologist  Anesthesia Plan Comments:         Anesthesia Quick Evaluation

## 2020-09-27 ENCOUNTER — Encounter (HOSPITAL_COMMUNITY): Payer: Self-pay | Admitting: Orthopedic Surgery

## 2020-09-29 ENCOUNTER — Ambulatory Visit (INDEPENDENT_AMBULATORY_CARE_PROVIDER_SITE_OTHER): Payer: BC Managed Care – PPO | Admitting: Physician Assistant

## 2020-09-29 ENCOUNTER — Encounter: Payer: Self-pay | Admitting: Physician Assistant

## 2020-09-29 DIAGNOSIS — S92352A Displaced fracture of fifth metatarsal bone, left foot, initial encounter for closed fracture: Secondary | ICD-10-CM

## 2020-09-29 NOTE — Progress Notes (Signed)
Office Visit Note   Patient: Robert Mccullough           Date of Birth: Mar 06, 1966           MRN: [id number] Visit Date: 09/29/2020              Requested by: Salli Real, MD [address] [place],  [place] [address] PCP: Salli Real, MD  Chief Complaint  Patient presents with  . Right Foot - Routine Post Op    09/25/19 ORIF right 5th MT fx       HPI: Patient is 5 days status post ORIF right fifth metatarsal fracture he is trying to remain nonweightbearing but admits sometimes it is difficult.  No complaints.  Denies fever, chills, or calf pain  Assessment & Plan: Visit Diagnoses: No diagnosis found.  Plan: Emphasized the importance of refraining from weightbearing.  We will follow-up in 1 week for removal of the suture.  X-rays of his foot should be obtained at that time  Follow-Up Instructions: No follow-ups on file.   Ortho Exam  Patient is alert, oriented, no adenopathy, well-dressed, normal affect, normal respiratory effort. Right foot sutures in place no wound dehiscence swelling is well controlled pulses are palpable.  Compartments are soft and compressible.  No cellulitis.  Imaging: No results found. No images are attached to the encounter.  Labs: No results found for: HGBA1C, ESRSEDRATE, CRP, LABURIC, REPTSTATUS, GRAMSTAIN, CULT, LABORGA   Lab Results  Component Value Date   ALBUMIN 4.0 09/24/2020    No results found for: MG No results found for: VD25OH  No results found for: PREALBUMIN CBC EXTENDED Latest Ref Rng & Units 09/24/2020 10/02/2009  WBC 4.0 - 10.5 K/uL 7.5 -  RBC 4.22 - 5.81 MIL/uL 4.67 -  HGB 13.0 - 17.0 g/dL 83.6 12.9(L)  HCT 39.0 - 52.0 % 44.1 38.0(L)  PLT 150 - 400 K/uL 331 -     There is no height or weight on file to calculate BMI.  Orders:  No orders of the defined types were placed in this encounter.  No orders of the defined types were placed in this encounter.    Procedures: No procedures performed  Clinical Data: No  additional findings.  ROS:  All other systems negative, except as noted in the HPI. Review of Systems  Objective: Vital Signs: There were no vitals taken for this visit.  Specialty Comments:  No specialty comments available.  PMFS History: Patient Active Problem List   Diagnosis Date Noted  . Closed fracture of bone of right foot    Past Medical History:  Diagnosis Date  . Anxiety   . Head injury    no loss of conciousness  . Hepatitis C    Has been treated  . Hypertension   . Lupus (HCC)     No family history on file.  Past Surgical History:  Procedure Laterality Date  . COLONOSCOPY    . FEMUR FRACTURE SURGERY Right    age 55  . ORIF TOE FRACTURE Right 09/24/2020   Procedure: OPEN REDUCTION INTERNAL FIXATION (ORIF) RIGHT FIFTH METATARSAL (TOE) FRACTURE;  Surgeon: Nadara Mustard, MD;  Location: [facility] OR;  Service: Orthopedics;  Laterality: Right;   Social History   Occupational History  . Not on file  Tobacco Use  . Smoking status: Current Every Day Smoker    Packs/day: 2.00    Years: 40.00    Pack years: 80.00  . Smokeless tobacco: Never Used  Vaping Use  .  Vaping Use: Never used  Substance and Sexual Activity  . Alcohol use: Yes    Alcohol/week: 12.0 standard drinks    Types: 12 Cans of beer per week  . Drug use: No  . Sexual activity: Not on file

## 2020-10-05 DIAGNOSIS — G4733 Obstructive sleep apnea (adult) (pediatric): Secondary | ICD-10-CM | POA: Diagnosis not present

## 2020-10-06 ENCOUNTER — Ambulatory Visit: Payer: BC Managed Care – PPO | Admitting: Physician Assistant

## 2020-10-18 ENCOUNTER — Encounter: Payer: Self-pay | Admitting: Physician Assistant

## 2020-10-18 ENCOUNTER — Ambulatory Visit (INDEPENDENT_AMBULATORY_CARE_PROVIDER_SITE_OTHER): Payer: BC Managed Care – PPO | Admitting: Physician Assistant

## 2020-10-18 ENCOUNTER — Ambulatory Visit (INDEPENDENT_AMBULATORY_CARE_PROVIDER_SITE_OTHER): Payer: BC Managed Care – PPO

## 2020-10-18 ENCOUNTER — Ambulatory Visit: Payer: Self-pay

## 2020-10-18 DIAGNOSIS — S99191A Other physeal fracture of right metatarsal, initial encounter for closed fracture: Secondary | ICD-10-CM

## 2020-10-18 DIAGNOSIS — S92352A Displaced fracture of fifth metatarsal bone, left foot, initial encounter for closed fracture: Secondary | ICD-10-CM

## 2020-10-18 NOTE — Progress Notes (Signed)
Office Visit Note   Patient: Robert Mccullough           Date of Birth: November 05, 1965           MRN: [id number] Visit Date: 10/18/2020              Requested by: Salli Real, MD [address] [place],  [place] [address] PCP: Salli Real, MD  No chief complaint on file.     HPI: Patient is a 55 year old gentleman who is a little over 3 weeks status post open reduction internal fixation of left fifth metatarsal shaft fracture.  He is full weightbearing in his fracture boot.  He says because of his job he has to be and is out walking on uneven surfaces.  He is however using the boot denies much pain  Assessment & Plan: Visit Diagnoses:  1. Displaced fracture of fifth metatarsal bone, left foot, initial encounter for closed fracture   2. Other physeal fracture of right metatarsal, initial encounter for closed fracture     Plan: Continue with the fracture boot until he is 4 weeks postop.  May then try transitioning to a supportive stiff shoe.  Should follow back in 4 weeks new x-rays of his foot should be taken  Follow-Up Instructions: No follow-ups on file.   Ortho Exam  Patient is alert, oriented, no adenopathy, well-dressed, normal affect, normal respiratory effort. Examination of his foot demonstrates well-healed surgical incision suture was removed today.  No swelling minimal tenderness to palpation over the fifth metatarsal.  No cellulitis or signs of infection  Imaging: No results found. No images are attached to the encounter.  Labs: No results found for: HGBA1C, ESRSEDRATE, CRP, LABURIC, REPTSTATUS, GRAMSTAIN, CULT, LABORGA   Lab Results  Component Value Date   ALBUMIN 4.0 09/24/2020    No results found for: MG No results found for: VD25OH  No results found for: PREALBUMIN CBC EXTENDED Latest Ref Rng & Units 09/24/2020 10/02/2009  WBC 4.0 - 10.5 K/uL 7.5 -  RBC 4.22 - 5.81 MIL/uL 4.67 -  HGB 13.0 - 17.0 g/dL 82.5 12.9(L)  HCT 39.0 - 52.0 % 44.1 38.0(L)  PLT 150 - 400  K/uL 331 -     There is no height or weight on file to calculate BMI.  Orders:  Orders Placed This Encounter  Procedures  . XR Foot Complete Right   No orders of the defined types were placed in this encounter.    Procedures: No procedures performed  Clinical Data: No additional findings.  ROS:  All other systems negative, except as noted in the HPI. Review of Systems  Objective: Vital Signs: There were no vitals taken for this visit.  Specialty Comments:  No specialty comments available.  PMFS History: Patient Active Problem List   Diagnosis Date Noted  . Closed fracture of bone of right foot    Past Medical History:  Diagnosis Date  . Anxiety   . Head injury    no loss of conciousness  . Hepatitis C    Has been treated  . Hypertension   . Lupus (HCC)     No family history on file.  Past Surgical History:  Procedure Laterality Date  . COLONOSCOPY    . FEMUR FRACTURE SURGERY Right    age 71  . ORIF TOE FRACTURE Right 09/24/2020   Procedure: OPEN REDUCTION INTERNAL FIXATION (ORIF) RIGHT FIFTH METATARSAL (TOE) FRACTURE;  Surgeon: Nadara Mustard, MD;  Location: [facility] OR;  Service: Orthopedics;  Laterality:  Right;   Social History   Occupational History  . Not on file  Tobacco Use  . Smoking status: Current Every Day Smoker    Packs/day: 2.00    Years: 40.00    Pack years: 80.00  . Smokeless tobacco: Never Used  Vaping Use  . Vaping Use: Never used  Substance and Sexual Activity  . Alcohol use: Yes    Alcohol/week: 12.0 standard drinks    Types: 12 Cans of beer per week  . Drug use: No  . Sexual activity: Not on file

## 2020-11-09 DIAGNOSIS — F1410 Cocaine abuse, uncomplicated: Secondary | ICD-10-CM | POA: Diagnosis not present

## 2020-11-15 ENCOUNTER — Ambulatory Visit: Payer: BC Managed Care – PPO | Admitting: Orthopaedic Surgery

## 2020-11-16 DIAGNOSIS — F1420 Cocaine dependence, uncomplicated: Secondary | ICD-10-CM | POA: Diagnosis not present

## 2021-01-03 DIAGNOSIS — G4733 Obstructive sleep apnea (adult) (pediatric): Secondary | ICD-10-CM | POA: Diagnosis not present

## 2021-01-24 DIAGNOSIS — G2581 Restless legs syndrome: Secondary | ICD-10-CM | POA: Diagnosis not present

## 2021-01-24 DIAGNOSIS — I10 Essential (primary) hypertension: Secondary | ICD-10-CM | POA: Diagnosis not present

## 2021-01-24 DIAGNOSIS — F419 Anxiety disorder, unspecified: Secondary | ICD-10-CM | POA: Diagnosis not present

## 2021-01-24 DIAGNOSIS — J449 Chronic obstructive pulmonary disease, unspecified: Secondary | ICD-10-CM | POA: Diagnosis not present

## 2021-04-04 DIAGNOSIS — G4733 Obstructive sleep apnea (adult) (pediatric): Secondary | ICD-10-CM | POA: Diagnosis not present

## 2021-07-04 DIAGNOSIS — G4733 Obstructive sleep apnea (adult) (pediatric): Secondary | ICD-10-CM | POA: Diagnosis not present

## 2021-08-04 DIAGNOSIS — G4733 Obstructive sleep apnea (adult) (pediatric): Secondary | ICD-10-CM | POA: Diagnosis not present

## 2021-08-29 DIAGNOSIS — K219 Gastro-esophageal reflux disease without esophagitis: Secondary | ICD-10-CM | POA: Diagnosis not present

## 2021-08-29 DIAGNOSIS — F17210 Nicotine dependence, cigarettes, uncomplicated: Secondary | ICD-10-CM | POA: Diagnosis not present

## 2021-08-29 DIAGNOSIS — J449 Chronic obstructive pulmonary disease, unspecified: Secondary | ICD-10-CM | POA: Diagnosis not present

## 2021-08-29 DIAGNOSIS — F419 Anxiety disorder, unspecified: Secondary | ICD-10-CM | POA: Diagnosis not present

## 2021-08-29 DIAGNOSIS — I10 Essential (primary) hypertension: Secondary | ICD-10-CM | POA: Diagnosis not present

## 2021-09-03 DIAGNOSIS — G4733 Obstructive sleep apnea (adult) (pediatric): Secondary | ICD-10-CM | POA: Diagnosis not present

## 2021-10-22 DIAGNOSIS — S32040A Wedge compression fracture of fourth lumbar vertebra, initial encounter for closed fracture: Secondary | ICD-10-CM | POA: Diagnosis not present

## 2021-10-29 DIAGNOSIS — M545 Low back pain, unspecified: Secondary | ICD-10-CM | POA: Diagnosis not present

## 2021-10-31 ENCOUNTER — Other Ambulatory Visit: Payer: Self-pay | Admitting: Physical Medicine & Rehabilitation

## 2021-10-31 DIAGNOSIS — S32040D Wedge compression fracture of fourth lumbar vertebra, subsequent encounter for fracture with routine healing: Secondary | ICD-10-CM | POA: Diagnosis not present

## 2021-10-31 DIAGNOSIS — S22000G Wedge compression fracture of unspecified thoracic vertebra, subsequent encounter for fracture with delayed healing: Secondary | ICD-10-CM

## 2021-11-18 ENCOUNTER — Other Ambulatory Visit: Payer: Self-pay

## 2021-11-18 ENCOUNTER — Ambulatory Visit
Admission: RE | Admit: 2021-11-18 | Discharge: 2021-11-18 | Disposition: A | Discharge status: Home / Self Care | Payer: BC Managed Care – PPO | Source: Ambulatory Visit | Attending: Physical Medicine & Rehabilitation | Admitting: Physical Medicine & Rehabilitation

## 2021-11-18 DIAGNOSIS — S22000G Wedge compression fracture of unspecified thoracic vertebra, subsequent encounter for fracture with delayed healing: Secondary | ICD-10-CM

## 2021-11-18 DIAGNOSIS — M810 Age-related osteoporosis without current pathological fracture: Secondary | ICD-10-CM | POA: Diagnosis not present

## 2021-11-18 DIAGNOSIS — E559 Vitamin D deficiency, unspecified: Secondary | ICD-10-CM | POA: Diagnosis not present

## 2021-11-18 DIAGNOSIS — R5383 Other fatigue: Secondary | ICD-10-CM | POA: Diagnosis not present

## 2021-11-18 DIAGNOSIS — M40204 Unspecified kyphosis, thoracic region: Secondary | ICD-10-CM | POA: Diagnosis not present

## 2021-11-19 DIAGNOSIS — Z0389 Encounter for observation for other suspected diseases and conditions ruled out: Secondary | ICD-10-CM | POA: Diagnosis not present

## 2021-12-08 DIAGNOSIS — M8580 Other specified disorders of bone density and structure, unspecified site: Secondary | ICD-10-CM | POA: Diagnosis not present

## 2021-12-08 DIAGNOSIS — E559 Vitamin D deficiency, unspecified: Secondary | ICD-10-CM | POA: Diagnosis not present

## 2021-12-09 DIAGNOSIS — S32040D Wedge compression fracture of fourth lumbar vertebra, subsequent encounter for fracture with routine healing: Secondary | ICD-10-CM | POA: Diagnosis not present

## 2022-01-19 DIAGNOSIS — Z6828 Body mass index (BMI) 28.0-28.9, adult: Secondary | ICD-10-CM | POA: Diagnosis not present

## 2022-01-19 DIAGNOSIS — L02415 Cutaneous abscess of right lower limb: Secondary | ICD-10-CM | POA: Diagnosis not present

## 2022-03-27 DIAGNOSIS — J449 Chronic obstructive pulmonary disease, unspecified: Secondary | ICD-10-CM | POA: Diagnosis not present

## 2022-03-27 DIAGNOSIS — G2581 Restless legs syndrome: Secondary | ICD-10-CM | POA: Diagnosis not present

## 2022-03-27 DIAGNOSIS — I10 Essential (primary) hypertension: Secondary | ICD-10-CM | POA: Diagnosis not present

## 2022-03-27 DIAGNOSIS — F419 Anxiety disorder, unspecified: Secondary | ICD-10-CM | POA: Diagnosis not present

## 2022-04-07 DIAGNOSIS — R059 Cough, unspecified: Secondary | ICD-10-CM | POA: Diagnosis not present

## 2022-04-07 DIAGNOSIS — R0602 Shortness of breath: Secondary | ICD-10-CM | POA: Diagnosis not present

## 2022-04-07 DIAGNOSIS — R509 Fever, unspecified: Secondary | ICD-10-CM | POA: Diagnosis not present

## 2022-06-11 ENCOUNTER — Ambulatory Visit
Admission: EM | Admit: 2022-06-11 | Discharge: 2022-06-11 | Disposition: A | Discharge status: Home / Self Care | Payer: BC Managed Care – PPO | Attending: Internal Medicine | Admitting: Internal Medicine

## 2022-06-11 ENCOUNTER — Ambulatory Visit (INDEPENDENT_AMBULATORY_CARE_PROVIDER_SITE_OTHER): Payer: BC Managed Care – PPO

## 2022-06-11 DIAGNOSIS — R051 Acute cough: Secondary | ICD-10-CM

## 2022-06-11 DIAGNOSIS — R059 Cough, unspecified: Secondary | ICD-10-CM | POA: Diagnosis not present

## 2022-06-11 DIAGNOSIS — R0602 Shortness of breath: Secondary | ICD-10-CM

## 2022-06-11 DIAGNOSIS — J069 Acute upper respiratory infection, unspecified: Secondary | ICD-10-CM

## 2022-06-11 DIAGNOSIS — R062 Wheezing: Secondary | ICD-10-CM | POA: Diagnosis not present

## 2022-06-11 MED ORDER — IPRATROPIUM-ALBUTEROL 0.5-2.5 (3) MG/3ML IN SOLN
3.0000 mL | Freq: Once | RESPIRATORY_TRACT | Status: AC
  Administered 2022-06-11: 3 mL via RESPIRATORY_TRACT

## 2022-06-11 MED ORDER — DOXYCYCLINE HYCLATE 100 MG PO CAPS: 100.0000 mg | ORAL_CAPSULE | Freq: Two times a day (BID) | ORAL | 0 refills | Status: AC

## 2022-06-11 MED ORDER — PREDNISONE 20 MG PO TABS: 40.0000 mg | ORAL_TABLET | Freq: Every day | ORAL | 0 refills | Status: AC

## 2022-06-11 MED ORDER — BENZONATATE 100 MG PO CAPS: 100.0000 mg | ORAL_CAPSULE | Freq: Three times a day (TID) | ORAL | 0 refills | Status: AC | PRN

## 2022-06-11 NOTE — ED Provider Notes (Addendum)
[facility]    CSN: [id number] Arrival date & time: 06/11/22  9937      History   Chief Complaint Chief Complaint  Patient presents with   Wheezing    HPI Robert Mccullough is a 56 y.o. male.   Patient presents with shortness of breath, wheezing, cough, nasal congestion that has been present for about a week.  Denies any known sick contacts or fever.  Denies chest pain, sore throat, ear pain, nausea, vomiting, diarrhea, abdominal pain.  Patient denies any formal diagnosis for COPD or asthma reports that he has been prescribed an albuterol inhaler to take as needed.  Has been using albuterol inhaler with no improvement in symptoms.  Patient reports that he also smokes 2 to 3 packs of cigarettes a day and has been doing this for about 30 years.   Wheezing   Past Medical History:  Diagnosis Date   Anxiety    Head injury    no loss of conciousness   Hepatitis C    Has been treated   Hypertension    Lupus [facility])     Patient Active Problem List   Diagnosis Date Noted   Closed fracture of bone of right foot     Past Surgical History:  Procedure Laterality Date   COLONOSCOPY     FEMUR FRACTURE SURGERY Right    age 89   ORIF TOE FRACTURE Right 09/24/2020   Procedure: OPEN REDUCTION INTERNAL FIXATION (ORIF) RIGHT FIFTH METATARSAL (TOE) FRACTURE;  Surgeon: Nadara Mustard, MD;  Location: [facility] OR;  Service: Orthopedics;  Laterality: Right;       Home Medications    Prior to Admission medications   Medication Sig Start Date End Date Taking? Authorizing Provider  benzonatate (TESSALON) 100 MG capsule Take 1 capsule (100 mg total) by mouth every 8 (eight) hours as needed for cough. 06/11/22  Yes Ayson Cherubini, Acie Fredrickson, FNP  doxycycline (VIBRAMYCIN) 100 MG capsule Take 1 capsule (100 mg total) by mouth 2 (two) times daily. 06/11/22  Yes Briget Shaheed, Rolly Salter E, FNP  predniSONE (DELTASONE) 20 MG tablet Take 2 tablets (40 mg total) by mouth daily for 5 days. 06/11/22 06/16/22 Yes Celedonio Sortino, Acie Fredrickson, FNP  albuterol (VENTOLIN HFA) 108 (90 Base) MCG/ACT inhaler Inhale 3 puffs into the lungs 3 (three) times daily with meals as needed (copd). 08/25/20   [provider]  ALPRAZolam Prudy Feeler) 0.5 MG tablet Take 0.5 mg by mouth 3 (three) times daily as needed for anxiety. 09/14/20   [provider]  amLODipine (NORVASC) 10 MG tablet Take 10 mg by mouth daily. 08/05/20   [provider]  ARIPiprazole (ABILIFY) 5 MG tablet Take 5 mg by mouth daily. 09/14/20   [provider]  BREO ELLIPTA 100-25 MCG/INH AEPB Inhale 1 puff into the lungs daily. 08/23/20   [provider]  ibuprofen (ADVIL) 200 MG tablet Take 600 mg by mouth 3 (three) times daily as needed for moderate pain or mild pain.    [provider]  metoprolol succinate (TOPROL-XL) 50 MG 24 hr tablet Take 50 mg by mouth daily. 08/21/20   [provider]  Multiple Vitamins-Minerals (MULTIVITAMIN WITH MINERALS) tablet Take 1 tablet by mouth daily.    [provider]  pantoprazole (PROTONIX) 40 MG injection Take 40 mg by mouth daily as needed for heartburn.    [provider]  sertraline (ZOLOFT) 100 MG tablet Take 100 mg by mouth daily.    [provider]  tiZANidine (  ZANAFLEX) 4 MG tablet Take 4-8 mg by mouth at bedtime as needed for sleep. 09/01/20   [provider]    Family History No family history on file.  Social History Social History   Tobacco Use   Smoking status: Every Day    Packs/day: 2.00    Years: 40.00    Total pack years: 80.00    Types: Cigarettes   Smokeless tobacco: Never  Vaping Use   Vaping Use: Never used  Substance Use Topics   Alcohol use: Yes    Alcohol/week: 42.0 standard drinks of alcohol    Types: 42 Cans of beer per week   Drug use: No     Allergies   Bupropion hcl er (xl) and Lisinopril   Review of Systems Review of Systems Per HPI  Physical Exam Triage Vital Signs ED Triage Vitals  Enc Vitals  Group     BP 06/11/22 0955 121/82     Pulse Rate 06/11/22 0955 78     Resp 06/11/22 0955 (!) 22     Temp 06/11/22 0955 (!) 97.4 F (36.3 C)     Temp src --      SpO2 06/11/22 0955 93 %     Weight --      Height --      Head Circumference --      Peak Flow --      Pain Score 06/11/22 0954 3     Pain Loc --      Pain Edu? --      Excl. in GC? --    No data found.  Updated Vital Signs BP 121/82   Pulse 78   Temp (!) 97.4 F (36.3 C)   Resp (!) 22   SpO2 97%   Visual Acuity Right Eye Distance:   Left Eye Distance:   Bilateral Distance:    Right Eye Near:   Left Eye Near:    Bilateral Near:     Physical Exam Constitutional:      General: He is not in acute distress.    Appearance: Normal appearance. He is not toxic-appearing or diaphoretic.  HENT:     Head: Normocephalic and atraumatic.     Right Ear: Tympanic membrane and ear canal normal.     Left Ear: Tympanic membrane and ear canal normal.     Nose: Congestion present.     Mouth/Throat:     Mouth: Mucous membranes are moist.     Pharynx: No posterior oropharyngeal erythema.  Eyes:     Extraocular Movements: Extraocular movements intact.     Conjunctiva/sclera: Conjunctivae normal.     Pupils: Pupils are equal, round, and reactive to light.  Cardiovascular:     Rate and Rhythm: Normal rate and regular rhythm.     Pulses: Normal pulses.     Heart sounds: Normal heart sounds.  Pulmonary:     Effort: Pulmonary effort is normal. No respiratory distress.     Breath sounds: Normal breath sounds. No stridor. No wheezing, rhonchi or rales.  Abdominal:     General: Abdomen is flat. Bowel sounds are normal.     Palpations: Abdomen is soft.  Musculoskeletal:        General: Normal range of motion.     Cervical back: Normal range of motion.  Skin:    General: Skin is warm and dry.  Neurological:     General: No focal deficit present.     Mental Status: He is alert and oriented  to person, place, and time. Mental  status is at baseline.  Psychiatric:        Mood and Affect: Mood normal.        Behavior: Behavior normal.      UC Treatments / Results  Labs (all labs ordered are listed, but only abnormal results are displayed) Labs Reviewed - No data to display  EKG   Radiology DG Chest 2 View  Result Date: 06/11/2022 CLINICAL DATA:  Shortness of breath, wheezing, and cough for 1 week. EXAM: CHEST - 2 VIEW COMPARISON:  02/19/2015 FINDINGS: The heart size and mediastinal contours are within normal limits. Both lungs are clear. Compression fracture of a midthoracic vertebral body is seen which is new since 2016, but is otherwise age indeterminate. IMPRESSION: No active cardiopulmonary disease. Age-indeterminate compression fracture of a midthoracic vertebral body. Electronically Signed   By: Marlaine Hind M.D.   On: 06/11/2022 10:33    Procedures Procedures (including critical care time)  Medications Ordered in UC Medications  ipratropium-albuterol (DUONEB) 0.5-2.5 (3) MG/3ML nebulizer solution 3 mL (3 mLs Nebulization Given 06/11/22 1005)    Initial Impression / Assessment and Plan / UC Course  I have reviewed the triage vital signs and the nursing notes.  Pertinent labs & imaging results that were available during my care of the patient were reviewed by me and considered in my medical decision making (see chart for details).     DuoNeb administered in urgent care today with improvement in symptoms.  Patient's symptoms most likely started off as a viral upper respiratory infection that is now persistent and worsening.  Chest x-ray was negative for any acute cardiopulmonary process.  Patient is not in any respiratory distress and oxygen is normal.  X-ray did show compression thoracic fracture.  After further review of patient's chart, it appears that this was found in March 2023 on CT imaging.  Patient also states that he is aware of this and given no obvious pain or symptomatic complications,  advised to follow up with spine speciality as he is already followed by them. Therefore, no further work-up or evaluation for this.  Advised patient to follow-up with spine specialty for this finding.  Given patient's history of smoking and worsening symptoms, will opt to treat with doxycycline antibiotic to cover for secondary bacterial infection.  Also prescribed prednisone to decrease inflammation as I do think the benefits outweigh risk and that should be beneficial for patient.  No obvious contraindications to prednisone noted on exam or in patient's history.  Also prescribed medications for supportive care.  Patient was given strict return and ER precautions.  Patient verbalized understanding and was agreeable with plan. Final Clinical Impressions(s) / UC Diagnoses   Final diagnoses:  Acute upper respiratory infection  Acute cough     Discharge Instructions      Your chest x-ray did not show any abnormalities other than a compression fracture of your thoracic spine.  Please follow-up with your spine specialist for this.  I have prescribed you 3 different medications to help alleviate symptoms.  Please take these medications with food.  Follow-up if symptoms persist or worsen.    ED Prescriptions     Medication Sig Dispense Auth. Provider   doxycycline (VIBRAMYCIN) 100 MG capsule Take 1 capsule (100 mg total) by mouth 2 (two) times daily. 20 capsule [place], Georgiana E, [place]   predniSONE (DELTASONE) 20 MG tablet Take 2 tablets (40 mg total) by mouth daily for 5 days. 10 tablet [place],  Acie Fredrickson, FNP   benzonatate (TESSALON) 100 MG capsule Take 1 capsule (100 mg total) by mouth every 8 (eight) hours as needed for cough. 21 capsule [place], Acie Fredrickson, [place]      PDMP not reviewed this encounter.   Gustavus Bryant, [place] 06/11/22 1138    [address], [place] 06/11/22 1140    Gustavus Bryant, [place] 06/11/22 1140

## 2022-06-11 NOTE — ED Triage Notes (Addendum)
Pt presents to [facility] with co of sob and wheezing, cough, congestion for one week. Pt reports he has been taking otc cold and cough medication but sob is worsening. Pt smokes 2 packs a day. Pt is rx albuterol inhaler that he has been using 2-3 times a day. Pt st the albuterol have not been helping with symptoms.

## 2022-06-11 NOTE — Discharge Instructions (Signed)
Your chest x-ray did not show any abnormalities other than a compression fracture of your thoracic spine.  Please follow-up with your spine specialist for this.  I have prescribed you 3 different medications to help alleviate symptoms.  Please take these medications with food.  Follow-up if symptoms persist or worsen.

## 2022-08-21 DIAGNOSIS — S39012A Strain of muscle, fascia and tendon of lower back, initial encounter: Secondary | ICD-10-CM | POA: Diagnosis not present

## 2022-08-25 ENCOUNTER — Ambulatory Visit
Admission: RE | Admit: 2022-08-25 | Discharge: 2022-08-25 | Disposition: A | Discharge status: Home / Self Care | Payer: BC Managed Care – PPO | Source: Ambulatory Visit | Attending: Surgery | Admitting: Surgery

## 2022-08-25 ENCOUNTER — Ambulatory Visit (INDEPENDENT_AMBULATORY_CARE_PROVIDER_SITE_OTHER): Payer: BC Managed Care – PPO | Admitting: Surgery

## 2022-08-25 ENCOUNTER — Telehealth: Payer: Self-pay | Admitting: Surgery

## 2022-08-25 ENCOUNTER — Ambulatory Visit: Payer: Self-pay

## 2022-08-25 DIAGNOSIS — S32040A Wedge compression fracture of fourth lumbar vertebra, initial encounter for closed fracture: Secondary | ICD-10-CM | POA: Diagnosis not present

## 2022-08-25 DIAGNOSIS — S32020A Wedge compression fracture of second lumbar vertebra, initial encounter for closed fracture: Secondary | ICD-10-CM | POA: Diagnosis not present

## 2022-08-25 DIAGNOSIS — M5442 Lumbago with sciatica, left side: Secondary | ICD-10-CM

## 2022-08-25 DIAGNOSIS — M48061 Spinal stenosis, lumbar region without neurogenic claudication: Secondary | ICD-10-CM | POA: Diagnosis not present

## 2022-08-25 DIAGNOSIS — M545 Low back pain, unspecified: Secondary | ICD-10-CM | POA: Diagnosis not present

## 2022-08-25 DIAGNOSIS — M5441 Lumbago with sciatica, right side: Secondary | ICD-10-CM

## 2022-08-25 MED ORDER — HYDROCODONE-ACETAMINOPHEN 5-325 MG PO TABS: 1.0000 | ORAL_TABLET | Freq: Four times a day (QID) | ORAL | 0 refills | Status: DC | PRN

## 2022-08-25 MED ORDER — METHOCARBAMOL 500 MG PO TABS: 500.0000 mg | ORAL_TABLET | Freq: Four times a day (QID) | ORAL | 0 refills | Status: AC | PRN

## 2022-08-25 NOTE — Telephone Encounter (Signed)
Please send any prescriptions to [facility] on [address] in [place]. Please advise patient..[phone]

## 2022-08-25 NOTE — Progress Notes (Signed)
Office Visit Note   Patient: Robert Mccullough           Date of Birth: 09-25-1965           MRN: [id number] Visit Date: 08/25/2022              Requested by: Salli Real, MD [address] [place],  [place] [address] PCP: Salli Real, MD   Assessment & Plan: Visit Diagnoses:  1. Acute bilateral low back pain with bilateral sciatica   2. Closed compression fracture of L2 vertebra, initial encounter (HCC)     Plan: I did ask Dr. Christell Constant to review x-ray lumbar spine today.  He does agree that patient has an L2 anterior compression fracture.  I will get CT lumbar spine today.  Dr. Christell Constant stated that bracing is not indicated.  Patient advised no bending, twisting, lifting.  Follow-up with Dr. Christell Constant in 2 weeks for recheck.  Will send in prescriptions for Norco and Robaxin.  Follow-Up Instructions: Return in about 2 weeks (around 09/08/2022) for with dr Christell Constant recheck L2 compression fracture.   Orders:  Orders Placed This Encounter  Procedures   XR Lumbar Spine 2-3 Views   CT LUMBAR SPINE WO CONTRAST   Meds ordered this encounter  Medications   methocarbamol (ROBAXIN) 500 MG tablet    Sig: Take 1 tablet (500 mg total) by mouth every 6 (six) hours as needed for muscle spasms.    Dispense:  50 tablet    Refill:  0   HYDROcodone-acetaminophen (NORCO/VICODIN) 5-325 MG tablet    Sig: Take 1 tablet by mouth every 6 (six) hours as needed for moderate pain.    Dispense:  40 tablet    Refill:  0      Procedures: No procedures performed   Clinical Data: No additional findings.   Subjective: Chief Complaint  Patient presents with   Lower Back - Pain    HPI 56 year old white male comes in today with complaints of acute low back pain.  Patient states that August 21, 2022 he was helping a friend move a car and was trying to lift the back end and pushed at the same time.  He felt a pop in his low back.  No complaints of lower extremity radicular symptoms.  States that he has pain in low  back with coughing, walking, sitting.  He has CT scan thoracic spine November 18, 2021 due to upper back pain from a heavy lifting injury.  That study had showed:  IMPRESSION: 1. Acute to subacute T8 compression fracture with inferior endplate comminution, up to 40% loss of height, mild paraspinal soft tissue edema, and associated vacuum disc at T8-T9. No retropulsion or complicating features. 2. Age indeterminate but probably chronic mild T7 and T10 compression fractures. Noncontrast thoracic spine CT or whole-body Nuclear Medicine Bone scan could confirm. 3. Aortic Atherosclerosis (ICD10-I70.0).     Electronically Signed   By: Odessa Fleming M.D.   On: 11/19/2021 06:19    Review of Systems No current complaints of cardiopulmonary GI/GU issues  Objective: Vital Signs: There were no vitals taken for this visit.  Physical Exam Constitutional:      General: He is in acute distress (Patient does appear to be in considerable amount of low back pain when walking).  HENT:     Nose: Nose normal.  Eyes:     Extraocular Movements: Extraocular movements intact.  Pulmonary:     Effort: No respiratory distress.  Musculoskeletal:  Comments: Patient has had some thoracolumbar paraspinal tenderness/spasm.  Negative logroll test.  Negative straight leg raise.  No focal motor deficits.  Neurological:     Mental Status: He is alert and oriented to person, place, and time.  Psychiatric:        Mood and Affect: Mood normal.     Ortho Exam  Specialty Comments:  No specialty comments available.  Imaging: CT LUMBAR SPINE WO CONTRAST  Result Date: 08/25/2022 CLINICAL DATA:  Back trauma, no prior imaging (Age >= 16y). Back pain. L2 compression fracture. EXAM: CT LUMBAR SPINE WITHOUT CONTRAST TECHNIQUE: Multidetector CT imaging of the lumbar spine was performed without intravenous contrast administration. Multiplanar CT image reconstructions were also generated. RADIATION DOSE REDUCTION: This exam  was performed according to the departmental dose-optimization program which includes automated exposure control, adjustment of the mA and/or kV according to patient size and/or use of iterative reconstruction technique. COMPARISON:  Lumbar spine radiographs 08/25/2022. Thoracic spine CT 11/18/2021. Lumbar spine MRI report from 10/29/2021 (images not available). FINDINGS: Segmentation: 5 lumbar type vertebrae. Alignment: Normal. Vertebrae: Chronic L4 inferior endplate compression fracture with 30% height loss (with this fracture described as acute in the prior MRI report). L2 superior endplate compression fracture with 30% height loss, new from the topogram from the 11/18/2021 thoracic spine CT though favored to be nonacute. No suspicious osseous lesion. Paraspinal and other soft tissues: Abdominal aortic atherosclerosis without aneurysm. Disc levels: T12-L1: Negative. L1-2: Minimal disc bulging without evidence of significant stenosis. L2-3: Mild disc bulging without evidence of significant stenosis. L3-4: Disc bulging, a left foraminal disc protrusion, dorsal epidural lipomatosis, and mild facet hypertrophy result in moderate spinal stenosis and mild right and moderate to severe left neural foraminal stenosis with potential left L3 nerve root impingement. L4-5: Disc bulging, a left subarticular to left foraminal disc protrusion, prominent dorsal epidural fat, and mild facet and ligamentum flavum hypertrophy result in moderate spinal stenosis, left greater than right lateral recess stenosis, and mild-to-moderate right and severe left neural foraminal stenosis. Potential left L4 and L5 nerve root impingement. L5-S1: Minimal disc bulging without stenosis. IMPRESSION: 1. L2 compression fracture with 30% height loss, new from 11/18/2021 though favored to be nonacute. 2. Chronic L4 compression fracture. 3. Moderate spinal stenosis and moderate to severe left neural foraminal stenosis at L3-4 and L4-5. 4.  Aortic  Atherosclerosis (ICD10-I70.0). Electronically Signed   By: Sebastian Ache M.D.   On: 08/25/2022 15:47     PMFS History: Patient Active Problem List   Diagnosis Date Noted   Closed fracture of bone of right foot    Past Medical History:  Diagnosis Date   Anxiety    Head injury    no loss of conciousness   Hepatitis C    Has been treated   Hypertension    Lupus (HCC)     No family history on file.  Past Surgical History:  Procedure Laterality Date   COLONOSCOPY     FEMUR FRACTURE SURGERY Right    age 80   ORIF TOE FRACTURE Right 09/24/2020   Procedure: OPEN REDUCTION INTERNAL FIXATION (ORIF) RIGHT FIFTH METATARSAL (TOE) FRACTURE;  Surgeon: Nadara Mustard, MD;  Location: [facility] OR;  Service: Orthopedics;  Laterality: Right;   Social History   Occupational History   Not on file  Tobacco Use   Smoking status: Every Day    Packs/day: 2.00    Years: 40.00    Total pack years: 80.00    Types: Cigarettes  Smokeless tobacco: Never  Vaping Use   Vaping Use: Never used  Substance and Sexual Activity   Alcohol use: Yes    Alcohol/week: 42.0 standard drinks of alcohol    Types: 42 Cans of beer per week   Drug use: No   Sexual activity: Not on file

## 2022-08-28 ENCOUNTER — Telehealth: Payer: Self-pay

## 2022-08-28 NOTE — Telephone Encounter (Signed)
PA for Hydrocodone has been submitted through Covermymeds. Pending PA# [id number]

## 2022-09-08 ENCOUNTER — Encounter: Payer: Self-pay | Admitting: Orthopedic Surgery

## 2022-09-08 ENCOUNTER — Telehealth: Payer: Self-pay | Admitting: Orthopedic Surgery

## 2022-09-08 ENCOUNTER — Ambulatory Visit: Payer: BC Managed Care – PPO | Admitting: Orthopedic Surgery

## 2022-09-08 ENCOUNTER — Ambulatory Visit (INDEPENDENT_AMBULATORY_CARE_PROVIDER_SITE_OTHER): Payer: BC Managed Care – PPO

## 2022-09-08 VITALS — BP 157/81 | HR 53 | Ht 73.0 in | Wt 225.0 lb

## 2022-09-08 DIAGNOSIS — M5442 Lumbago with sciatica, left side: Secondary | ICD-10-CM | POA: Diagnosis not present

## 2022-09-08 DIAGNOSIS — M5441 Lumbago with sciatica, right side: Secondary | ICD-10-CM | POA: Diagnosis not present

## 2022-09-08 MED ORDER — OXYCODONE HCL 5 MG PO TABS: 5.0000 mg | ORAL_TABLET | ORAL | 0 refills | Status: AC | PRN

## 2022-09-08 MED ORDER — OXYCODONE HCL 5 MG PO CAPS: 5.0000 mg | ORAL_CAPSULE | ORAL | 0 refills | Status: DC | PRN

## 2022-09-08 NOTE — Telephone Encounter (Signed)
Pharmacy called stating Insurance does not cover Oxy 5 cap needs to change it over to oxy 5 tab please advise

## 2022-09-08 NOTE — Progress Notes (Signed)
Orthopedic Spine Surgery Office Note  Assessment: Patient is a 56 y.o. male with chronic L4 compression fx, new L2 compression fx   Plan: -Patient has been taking Norco and Robaxin as prescribed by Zonia Kief, PA. Prescribed oxycodone. Instructed him to wean off of this medication and use more OTC medications as time goes on. Informed him that I would not prescribe any narcotics at or after our next visit for this injury -Recommended smoking cessation to mitigate risk of further fractures. Discussed osteoporosis treatment and work up of secondary causes with his primary care provider since by definition he has osteoporosis.  -Also, recommended he work on weight loss to help with back pain for the long-term -Patient should return to office in 3 weeks, x-rays at next visit: lumbar AP/lateral   Patient expressed understanding of the plan and all questions were answered to the patient's satisfaction.   ___________________________________________________________________________   History:  Patient is a 56 y.o. male who presents today for lumbar spine. He was helping a friend move a car on 08/21/2022 when he noted acute onset of low back pain. Saw Zonia Kief, PA on 08/25/2022 at which time he was diagnosed with a L2 compression fracture.  Pain has slowly been getting better.  However, pain is still significant.  He is not as active as he was prior to the injury.  He has been doing light duty at work.  He has no pain radiating into either leg.   Weakness: denies Symptoms of imbalance: denies Paresthesias and numbness: denies Bowel or bladder incontinence: denies Saddle anesthesia: denies  Treatments tried: activity modification, robaxin, norco  Review of systems: Denies fevers and chills, night sweats, unexplained weight loss, history of cancer. Has had pain that wakes him at night  Past medical history: HTN GERD Sleep apnea RA Chronic pain    Allergies: NKDA  Past surgical  history:  Right 5th toe fracture ORIF Right femur fracture ORIF  Social history: Reports use of nicotine product (smoking, vaping, patches, smokeless) - 2ppd Alcohol use: yes, 12 beers per week Denies recreational drug use   Physical Exam:  General: no acute distress, appears stated age Neurologic: alert, answering questions appropriately, following commands Respiratory: unlabored breathing on room air, symmetric chest rise Psychiatric: appropriate affect, normal cadence to speech   MSK (spine):  -Strength exam      Left  Right EHL    5/5  5/5 TA    5/5  5/5 GSC    5/5  5/5 Knee extension  5/5  5/5 Hip flexion   5/5  5/5  -Sensory exam    Sensation intact to light touch in L3-S1 nerve distributions of bilateral lower extremities  -Straight leg raise: negative -Contralateral straight leg raise: negative -Clonus: no beats bilaterally  Imaging: XR of the lumbar spine from 09/08/2022 was independently reviewed and interpreted, showing L2 compression fracture. Chronic L4 compression fracture. Lordotic alignment.   CT of the lumbar spine from 08/25/2022 was independently reviewed and interpreted, showing L2 compression fracture. Old L4 compression fracture. No middle or posterior column involvement for either fracture.    Patient name: Robert Mccullough Patient MRN: [id number] Date of visit: 09/08/22

## 2022-09-08 NOTE — Addendum Note (Signed)
Addended by: Willia Craze on: 09/08/2022 04:28 PM   Modules accepted: Orders

## 2022-09-28 ENCOUNTER — Ambulatory Visit (INDEPENDENT_AMBULATORY_CARE_PROVIDER_SITE_OTHER): Payer: BC Managed Care – PPO

## 2022-09-28 ENCOUNTER — Ambulatory Visit: Payer: BC Managed Care – PPO | Admitting: Orthopedic Surgery

## 2022-09-28 DIAGNOSIS — S32020A Wedge compression fracture of second lumbar vertebra, initial encounter for closed fracture: Secondary | ICD-10-CM

## 2022-09-28 NOTE — Progress Notes (Signed)
Orthopedic Spine Surgery Office Note  Assessment: Patient is a 57 y.o. male with chronic L4 compression fx, new L2 compression fx (~1 month from injury)   Plan: -No acute operative intervention -Since no medications seem to help, recommended he continue with activity modification and brace use.  Will discontinue brace at 3 months from injury -No bending/lifting/twisting greater than 10 pounds for 3 months from injury -Explained that he has several compression fractures with low-energy mechanisms which gives him a diagnosis of osteoporosis.  He is seeing his primary care doctor to evaluate for secondary causes but I suspect smoking is part of the reason for the osteoporosis -Encouraged smoking cessation again -Patient should return to office in 4 weeks, x-rays at next visit: AP/lateral lumbar   Patient expressed understanding of the plan and all questions were answered to the patient's satisfaction.   ___________________________________________________________________________  History: Patient is a 57 y.o. male who has been previously seen in the office for lumbar compression fracture. Since the last visit, his back pain has slowly improved.  He has been using a off-the-shelf LSO that gives him some relief.  He has not had any relief with medications.  He has been able to walk 2 to 5 miles per day on his job.  He is not having any pain radiating into either leg.  He has not noticed any weakness.  No change in bowel or bladder habits. Denies paresthesias and numbness.    Previous treatments: Activity modification, Robaxin, Norco, brace use, oxycodone  COPY OF FIRST NOTE Patient is a 57 y.o. male who presents today for lumbar spine. He was helping a friend move a car on 08/21/2022 when he noted acute onset of low back pain. Saw Benjiman Core, PA on 08/25/2022 at which time he was diagnosed with a L2 compression fracture.  Pain has slowly been getting better.  However, pain is still significant.  He  is not as active as he was prior to the injury.  He has been doing light duty at work.  He has no pain radiating into either leg.     Weakness: denies Symptoms of imbalance: denies Paresthesias and numbness: denies Bowel or bladder incontinence: denies Saddle anesthesia: denies END OF COPY  Physical Exam:  General: no acute distress, appears stated age Neurologic: alert, answering questions appropriately, following commands Respiratory: unlabored breathing on room air, symmetric chest rise Psychiatric: appropriate affect, normal cadence to speech   MSK (spine):  -Strength exam      Left  Right EHL    5/5  5/5 TA    5/5  5/5 GSC    5/5  5/5 Knee extension  5/5  5/5 Hip flexion   5/5  5/5  -Sensory exam    Sensation intact to light touch in L3-S1 nerve distributions of bilateral lower extremities  -Clonus: no beats bilaterally  Imaging: XR of the lumbar spine from 09/28/2022 was independently reviewed and interpreted, showing chronic L4 compression fracture with no change in alignment or height loss.  L2 compression fracture with increased height loss since prior films on 09/08/2022.  No retropulsion seen.  No new fracture seen.   Patient name: Robert Mccullough Patient MRN: [id number] Date of visit: 09/28/22

## 2022-10-09 DIAGNOSIS — I10 Essential (primary) hypertension: Secondary | ICD-10-CM | POA: Diagnosis not present

## 2022-10-09 DIAGNOSIS — K219 Gastro-esophageal reflux disease without esophagitis: Secondary | ICD-10-CM | POA: Diagnosis not present

## 2022-10-09 DIAGNOSIS — M797 Fibromyalgia: Secondary | ICD-10-CM | POA: Diagnosis not present

## 2022-10-09 DIAGNOSIS — G2581 Restless legs syndrome: Secondary | ICD-10-CM | POA: Diagnosis not present

## 2022-11-03 ENCOUNTER — Ambulatory Visit: Payer: BC Managed Care – PPO | Admitting: Orthopedic Surgery

## 2023-04-09 DIAGNOSIS — I10 Essential (primary) hypertension: Secondary | ICD-10-CM | POA: Diagnosis not present

## 2023-04-09 DIAGNOSIS — G2581 Restless legs syndrome: Secondary | ICD-10-CM | POA: Diagnosis not present

## 2023-04-09 DIAGNOSIS — F419 Anxiety disorder, unspecified: Secondary | ICD-10-CM | POA: Diagnosis not present

## 2023-04-09 DIAGNOSIS — J449 Chronic obstructive pulmonary disease, unspecified: Secondary | ICD-10-CM | POA: Diagnosis not present

## 2023-06-01 DIAGNOSIS — G4733 Obstructive sleep apnea (adult) (pediatric): Secondary | ICD-10-CM | POA: Diagnosis not present

## 2023-07-01 DIAGNOSIS — G4733 Obstructive sleep apnea (adult) (pediatric): Secondary | ICD-10-CM | POA: Diagnosis not present

## 2023-08-01 DIAGNOSIS — G4733 Obstructive sleep apnea (adult) (pediatric): Secondary | ICD-10-CM | POA: Diagnosis not present

## 2023-10-15 DIAGNOSIS — I10 Essential (primary) hypertension: Secondary | ICD-10-CM | POA: Diagnosis not present

## 2023-10-15 DIAGNOSIS — G2581 Restless legs syndrome: Secondary | ICD-10-CM | POA: Diagnosis not present

## 2023-10-15 DIAGNOSIS — Z23 Encounter for immunization: Secondary | ICD-10-CM | POA: Diagnosis not present

## 2023-10-15 DIAGNOSIS — K219 Gastro-esophageal reflux disease without esophagitis: Secondary | ICD-10-CM | POA: Diagnosis not present

## 2023-10-15 DIAGNOSIS — M797 Fibromyalgia: Secondary | ICD-10-CM | POA: Diagnosis not present

## 2023-11-29 DIAGNOSIS — G4733 Obstructive sleep apnea (adult) (pediatric): Secondary | ICD-10-CM | POA: Diagnosis not present

## 2023-12-30 DIAGNOSIS — G4733 Obstructive sleep apnea (adult) (pediatric): Secondary | ICD-10-CM | POA: Diagnosis not present

## 2024-01-29 DIAGNOSIS — G4733 Obstructive sleep apnea (adult) (pediatric): Secondary | ICD-10-CM | POA: Diagnosis not present

## 2024-02-24 DIAGNOSIS — S39012A Strain of muscle, fascia and tendon of lower back, initial encounter: Secondary | ICD-10-CM | POA: Diagnosis not present

## 2024-02-24 DIAGNOSIS — M1990 Unspecified osteoarthritis, unspecified site: Secondary | ICD-10-CM | POA: Diagnosis not present

## 2024-02-24 DIAGNOSIS — M549 Dorsalgia, unspecified: Secondary | ICD-10-CM | POA: Diagnosis not present

## 2024-02-25 DIAGNOSIS — S32040D Wedge compression fracture of fourth lumbar vertebra, subsequent encounter for fracture with routine healing: Secondary | ICD-10-CM | POA: Diagnosis not present

## 2024-03-12 ENCOUNTER — Other Ambulatory Visit: Payer: Self-pay | Admitting: Physician Assistant

## 2024-03-12 DIAGNOSIS — M545 Low back pain, unspecified: Secondary | ICD-10-CM

## 2024-03-12 DIAGNOSIS — S32040D Wedge compression fracture of fourth lumbar vertebra, subsequent encounter for fracture with routine healing: Secondary | ICD-10-CM | POA: Diagnosis not present

## 2024-03-13 ENCOUNTER — Ambulatory Visit
Admission: RE | Admit: 2024-03-13 | Discharge: 2024-03-13 | Disposition: A | Discharge status: Home / Self Care | Source: Ambulatory Visit | Attending: Physician Assistant | Admitting: Physician Assistant

## 2024-03-13 DIAGNOSIS — M47816 Spondylosis without myelopathy or radiculopathy, lumbar region: Secondary | ICD-10-CM | POA: Diagnosis not present

## 2024-03-13 DIAGNOSIS — M545 Low back pain, unspecified: Secondary | ICD-10-CM

## 2024-03-13 DIAGNOSIS — M5126 Other intervertebral disc displacement, lumbar region: Secondary | ICD-10-CM | POA: Diagnosis not present

## 2024-03-17 ENCOUNTER — Other Ambulatory Visit

## 2024-03-20 DIAGNOSIS — M545 Low back pain, unspecified: Secondary | ICD-10-CM | POA: Diagnosis not present

## 2024-03-20 DIAGNOSIS — S32040D Wedge compression fracture of fourth lumbar vertebra, subsequent encounter for fracture with routine healing: Secondary | ICD-10-CM | POA: Diagnosis not present

## 2024-03-28 DIAGNOSIS — M545 Low back pain, unspecified: Secondary | ICD-10-CM | POA: Diagnosis not present

## 2024-04-08 DIAGNOSIS — M545 Low back pain, unspecified: Secondary | ICD-10-CM | POA: Diagnosis not present

## 2024-04-08 DIAGNOSIS — S32040D Wedge compression fracture of fourth lumbar vertebra, subsequent encounter for fracture with routine healing: Secondary | ICD-10-CM | POA: Diagnosis not present

## 2024-04-14 DIAGNOSIS — M797 Fibromyalgia: Secondary | ICD-10-CM | POA: Diagnosis not present

## 2024-04-14 DIAGNOSIS — K219 Gastro-esophageal reflux disease without esophagitis: Secondary | ICD-10-CM | POA: Diagnosis not present

## 2024-04-14 DIAGNOSIS — I10 Essential (primary) hypertension: Secondary | ICD-10-CM | POA: Diagnosis not present

## 2024-04-14 DIAGNOSIS — G2581 Restless legs syndrome: Secondary | ICD-10-CM | POA: Diagnosis not present

## 2024-04-30 DIAGNOSIS — M85851 Other specified disorders of bone density and structure, right thigh: Secondary | ICD-10-CM | POA: Diagnosis not present

## 2024-04-30 DIAGNOSIS — Z8781 Personal history of (healed) traumatic fracture: Secondary | ICD-10-CM | POA: Diagnosis not present

## 2024-04-30 DIAGNOSIS — M8589 Other specified disorders of bone density and structure, multiple sites: Secondary | ICD-10-CM | POA: Diagnosis not present

## 2024-08-24 DIAGNOSIS — G4733 Obstructive sleep apnea (adult) (pediatric): Secondary | ICD-10-CM | POA: Diagnosis not present
